# Patient Record
Sex: Male | Born: 1955 | Race: White | Hispanic: No | Marital: Married | Smoking: Never smoker
Health system: Southern US, Community
[De-identification: ages and names within clinical notes are randomized; demographics above are authoritative.]

## PROBLEM LIST (undated history)

## (undated) DIAGNOSIS — E119 Type 2 diabetes mellitus without complications: Secondary | ICD-10-CM

## (undated) DIAGNOSIS — H3530 Unspecified macular degeneration: Secondary | ICD-10-CM

## (undated) DIAGNOSIS — I10 Essential (primary) hypertension: Secondary | ICD-10-CM

## (undated) DIAGNOSIS — K760 Fatty (change of) liver, not elsewhere classified: Secondary | ICD-10-CM

## (undated) DIAGNOSIS — R1310 Dysphagia, unspecified: Secondary | ICD-10-CM

## (undated) DIAGNOSIS — K589 Irritable bowel syndrome without diarrhea: Secondary | ICD-10-CM

## (undated) HISTORY — DX: Irritable bowel syndrome, unspecified: K58.9

## (undated) HISTORY — DX: Dysphagia, unspecified: R13.10

## (undated) HISTORY — DX: Essential (primary) hypertension: I10

## (undated) HISTORY — PX: TONSILLECTOMY: SUR1361

## (undated) HISTORY — PX: KNEE SURGERY: SHX244

---

## 1983-02-23 HISTORY — PX: KNEE SURGERY: SHX244

## 1986-02-22 DIAGNOSIS — K759 Inflammatory liver disease, unspecified: Secondary | ICD-10-CM

## 1986-02-22 HISTORY — DX: Inflammatory liver disease, unspecified: K75.9

## 2003-05-27 ENCOUNTER — Encounter: Admission: RE | Admit: 2003-05-27 | Discharge: 2003-05-27 | Payer: Self-pay | Admitting: Family Medicine

## 2003-07-02 ENCOUNTER — Encounter: Admission: RE | Admit: 2003-07-02 | Discharge: 2003-07-02 | Payer: Self-pay | Admitting: Family Medicine

## 2004-02-23 HISTORY — PX: CATARACT EXTRACTION, BILATERAL: SHX1313

## 2004-11-12 ENCOUNTER — Encounter: Admission: RE | Admit: 2004-11-12 | Discharge: 2004-11-12 | Payer: Self-pay | Admitting: Endocrinology

## 2005-09-23 IMAGING — US US ABDOMEN COMPLETE
1 series · 14 of 25 positions shown · non-contrast
Comparison: None.

CLINICAL DATA: Abnormal LFTs.  Evaluate for fatty infiltration. 
 COMPLETE ABDOMINAL ULTRASOUND:

[Series 1: unknown · 14 of 65 slices shown]
[im 1/65]
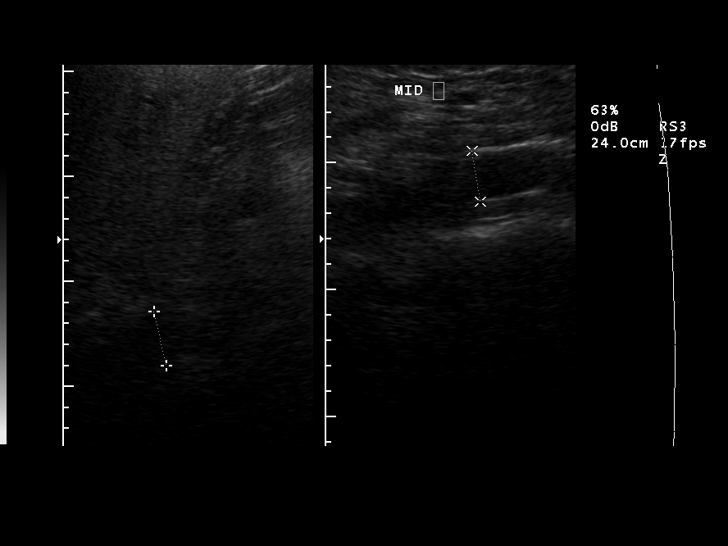
[im 6/65]
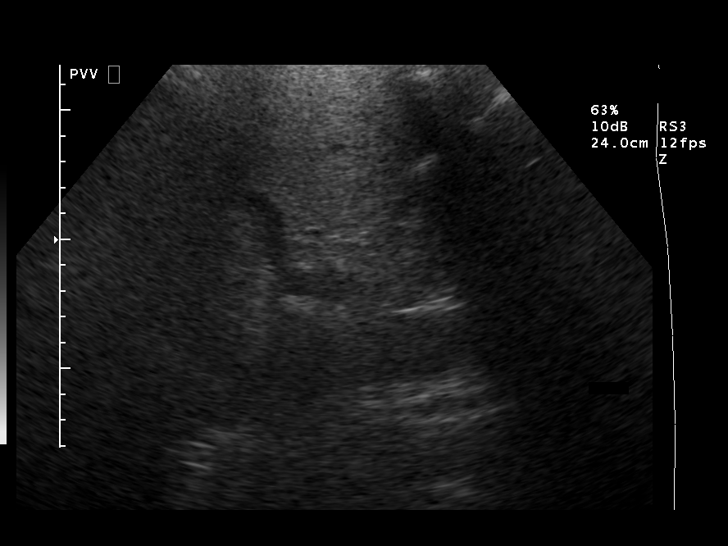
[im 11/65]
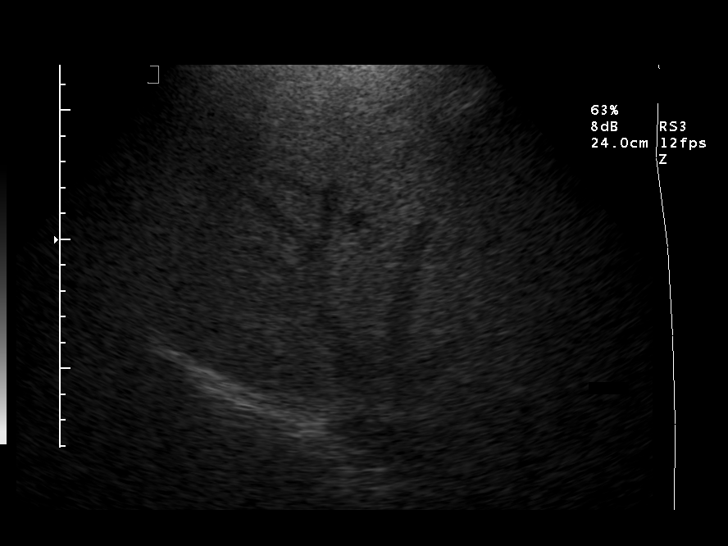
[im 17/65]
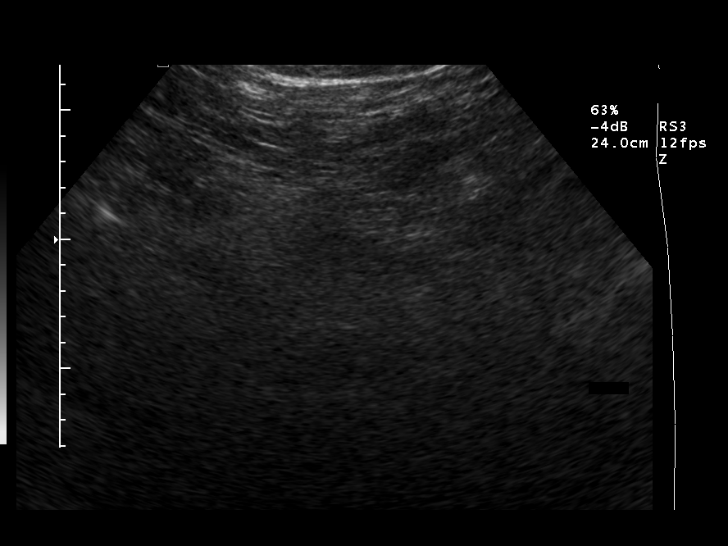
[im 22/65]
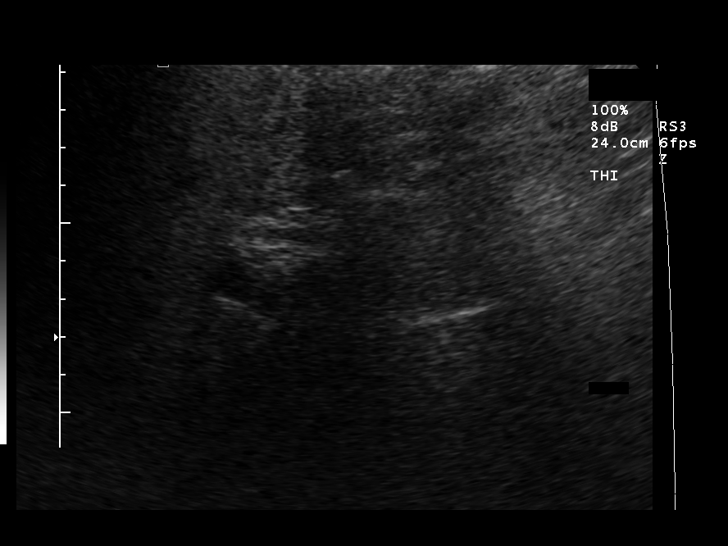
[im 25/65]
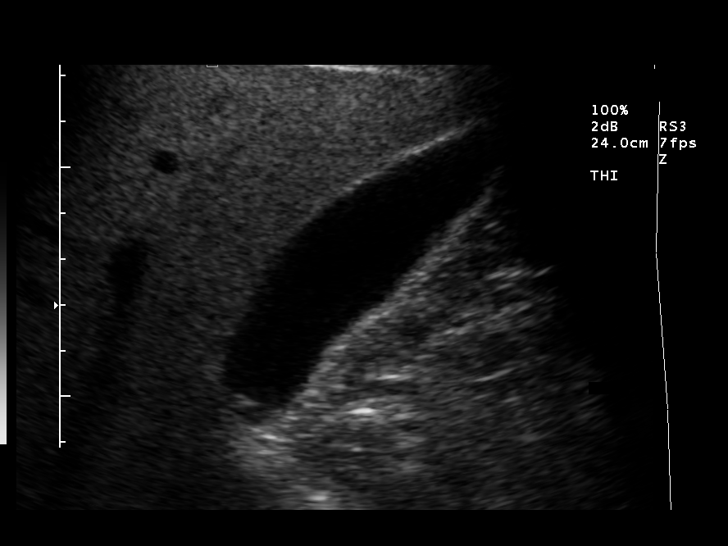
[im 30/65]
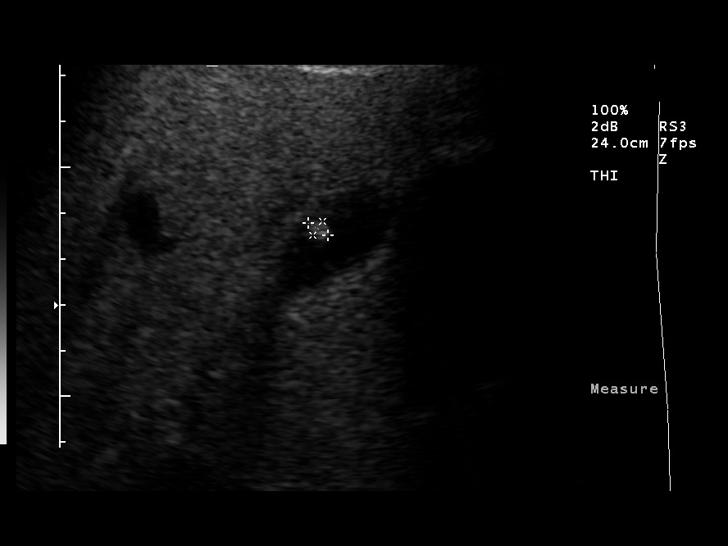
[im 35/65]
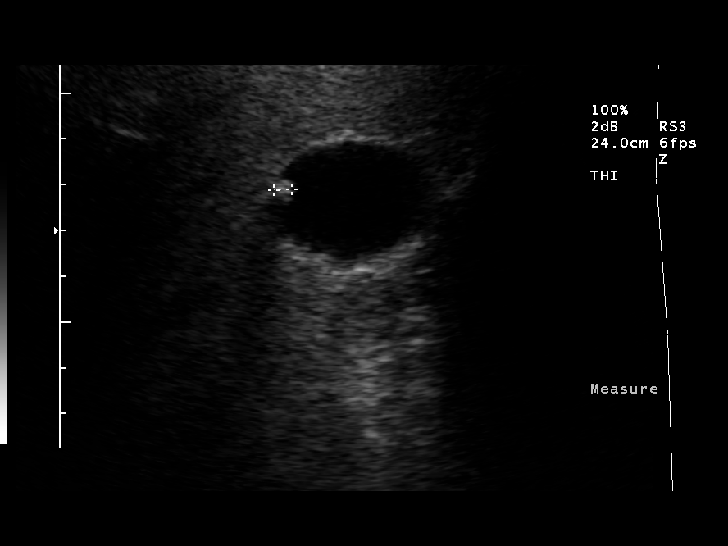
[im 41/65]
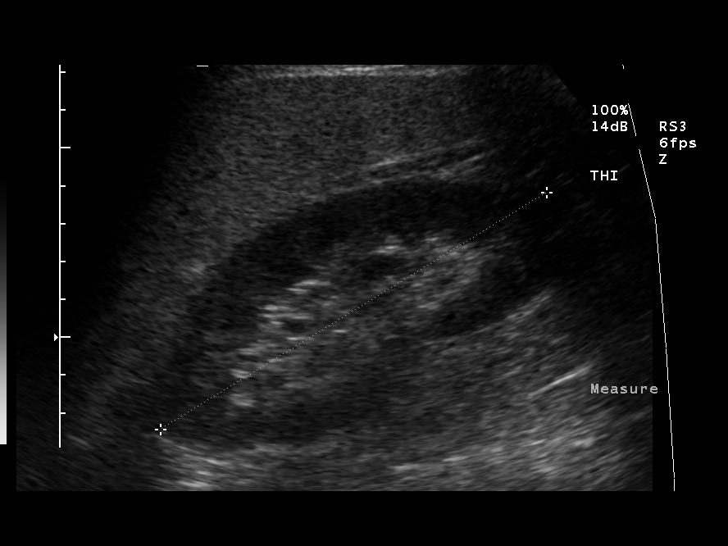
[im 43/65]
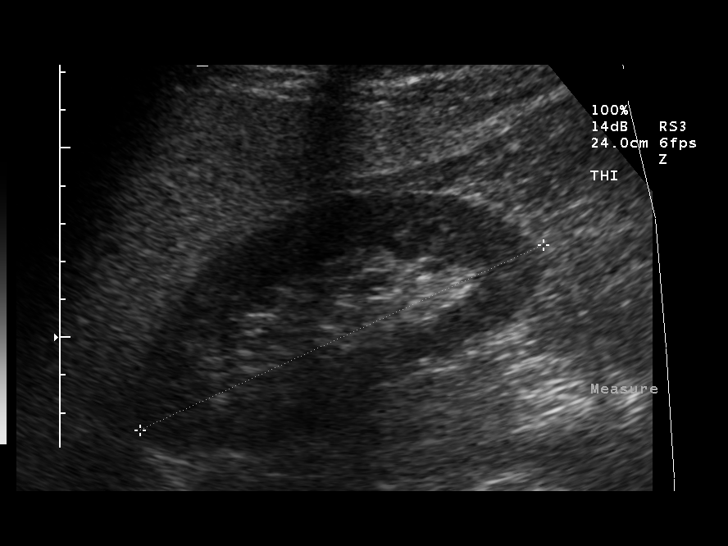
[im 49/65]
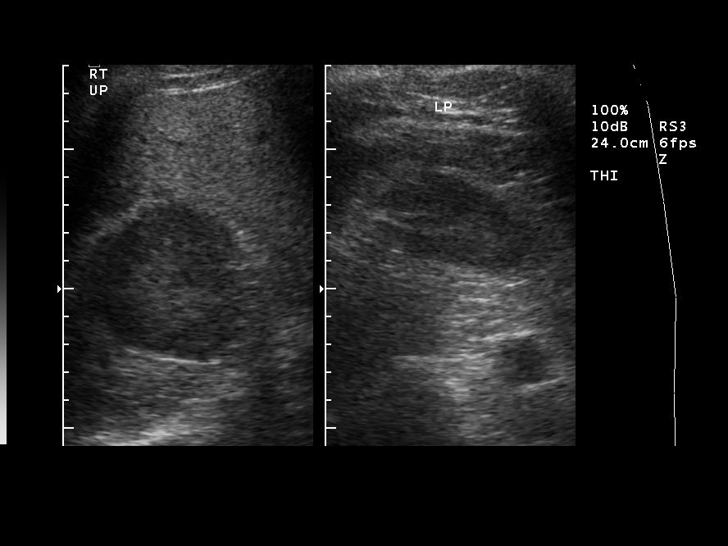
[im 54/65]
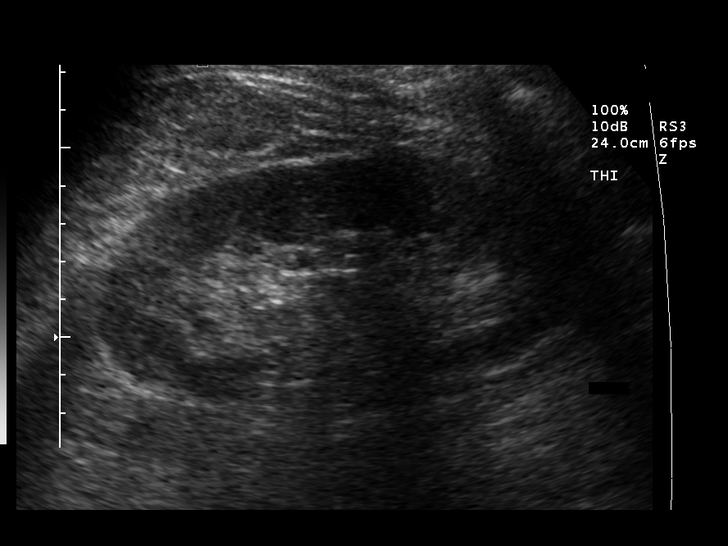
[im 59/65]
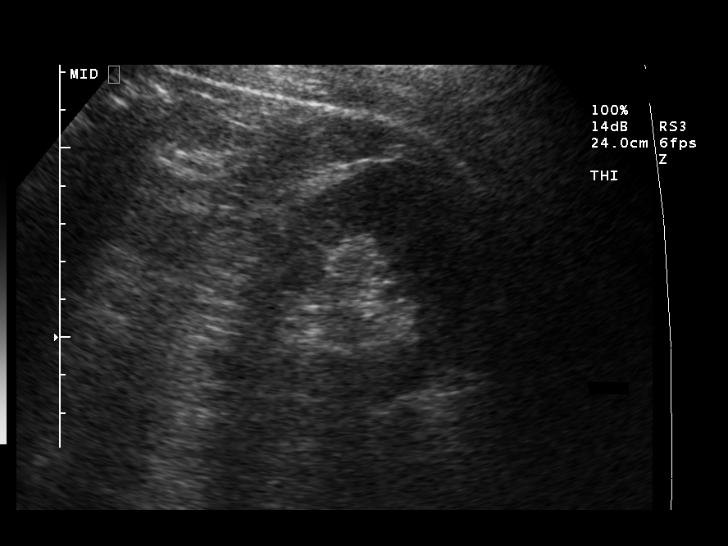
[im 65/65]
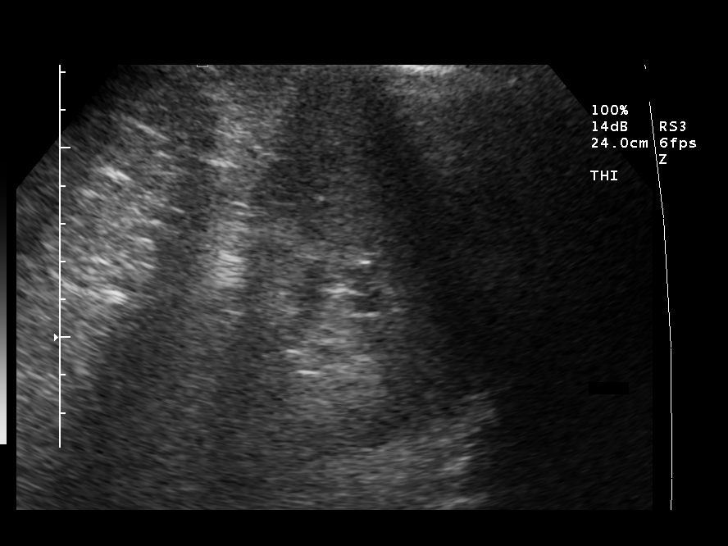

[14 of 25 positions shown; findings below may reference images not displayed]

Diffuse increased echogenicity throughout the liver consistent with fatty infiltration.  The gallbladder has no wall thickening, pericholecystic fluid, or stones.  There are two polypoid filling defects within the non dependent portion of the gallbladder measuring 5 x 4 x 4mm.  A second one is seen within the dependent portion of the gallbladder measuring 5 x 3 x 5mm.  These likely represent gallbladder polyps.  Follow up examination to ensure stability is recommended. 
 Inferior vena cava and pancreas are obscured by overlying bowel gas. 
 Spleen is normal measuring 10.4cm in length. 
 Right kidney is normal measuring 12.0cm in length.  Left kidney is normal measuring 12.7cm in length.  
 The abdominal aorta is normal in AP diameter measuring 2.6cm.
IMPRESSION: 1.  Increased echogenicity of the liver consistent with fatty infiltration. 
 2.  Two polypoid filling defects within the gallbladder measuring 5mm.  Follow up examination in six months is recommended to ensure stability.

## 2007-12-18 ENCOUNTER — Ambulatory Visit: Payer: Self-pay | Admitting: Vascular Surgery

## 2010-07-07 NOTE — Procedures (Signed)
RENAL ARTERY DUPLEX EVALUATION   INDICATION:  Uncontrolled hypertension.   HISTORY:  Diabetes:  No.  Cardiac:  No.  Hypertension:  Yes.  Smoking:  No.   RENAL ARTERY DUPLEX FINDINGS:  Aorta-Proximal:  Not visualized  Aorta-Mid:  86 cm/s  Aorta-Distal:  104 cm/s  Celiac Artery Origin:  Not visualized  SMA Origin:  Not visualized                                    RIGHT               LEFT  Renal Artery Origin:             81 cm/s             76 cm/s  Renal Artery Proximal:           Not visualized      95 cm/s  Renal Artery Mid:                47 cm/s             52 cm/s  Renal Artery Distal:             54 cm/s             57 cm/s  Hilar Acceleration Time (AT):  Renal-Aortic Ratio (RAR):        0.94                1.1  Kidney Size:                     11.32 cm            12.8 cm  End Diastolic Ratio (EDR):  Resistive Index (RI):            0.67                0.67   IMPRESSION:  1. No evidence of increased velocities noted in the bilateral renal      arteries, based on limited visualization.  2. The bilateral kidney sizes and resistive indices are within normal      limits.  3. Unable to adequately visualize the proximal aorta, celiac, superior      mesenteric, and proximal right renal arteries due to overlying      bowel gas patterns.   Preliminary report was faxed to Dr. Elvis Coil office on 12/18/2007.   ___________________________________________  Janetta Hora. Fields, MD   CH/MEDQ  D:  12/18/2007  T:  12/18/2007  Job:  [id number]

## 2010-09-18 ENCOUNTER — Encounter (INDEPENDENT_AMBULATORY_CARE_PROVIDER_SITE_OTHER): Payer: BC Managed Care – PPO | Admitting: Ophthalmology

## 2010-09-18 DIAGNOSIS — H3530 Unspecified macular degeneration: Secondary | ICD-10-CM

## 2010-09-18 DIAGNOSIS — H43819 Vitreous degeneration, unspecified eye: Secondary | ICD-10-CM

## 2010-09-18 DIAGNOSIS — H2510 Age-related nuclear cataract, unspecified eye: Secondary | ICD-10-CM

## 2011-07-23 ENCOUNTER — Encounter: Payer: Self-pay | Admitting: *Deleted

## 2011-07-23 DIAGNOSIS — I10 Essential (primary) hypertension: Secondary | ICD-10-CM

## 2011-09-20 ENCOUNTER — Encounter (INDEPENDENT_AMBULATORY_CARE_PROVIDER_SITE_OTHER): Payer: BC Managed Care – PPO | Admitting: Ophthalmology

## 2011-09-20 DIAGNOSIS — H43819 Vitreous degeneration, unspecified eye: Secondary | ICD-10-CM

## 2011-09-20 DIAGNOSIS — H3551 Vitreoretinal dystrophy: Secondary | ICD-10-CM

## 2011-09-20 DIAGNOSIS — H2510 Age-related nuclear cataract, unspecified eye: Secondary | ICD-10-CM

## 2011-09-20 DIAGNOSIS — H3530 Unspecified macular degeneration: Secondary | ICD-10-CM

## 2012-02-23 DIAGNOSIS — C801 Malignant (primary) neoplasm, unspecified: Secondary | ICD-10-CM

## 2012-02-23 HISTORY — DX: Malignant (primary) neoplasm, unspecified: C80.1

## 2012-09-25 ENCOUNTER — Ambulatory Visit (INDEPENDENT_AMBULATORY_CARE_PROVIDER_SITE_OTHER): Payer: BC Managed Care – PPO | Admitting: Ophthalmology

## 2012-09-25 DIAGNOSIS — H43819 Vitreous degeneration, unspecified eye: Secondary | ICD-10-CM

## 2012-09-25 DIAGNOSIS — H3530 Unspecified macular degeneration: Secondary | ICD-10-CM

## 2013-02-12 ENCOUNTER — Encounter: Payer: Self-pay | Admitting: *Deleted

## 2013-02-12 ENCOUNTER — Encounter: Payer: Self-pay | Admitting: Cardiology

## 2013-02-13 ENCOUNTER — Encounter (INDEPENDENT_AMBULATORY_CARE_PROVIDER_SITE_OTHER): Payer: Self-pay

## 2013-02-13 ENCOUNTER — Encounter: Payer: Self-pay | Admitting: Interventional Cardiology

## 2013-02-13 ENCOUNTER — Ambulatory Visit (INDEPENDENT_AMBULATORY_CARE_PROVIDER_SITE_OTHER): Payer: BC Managed Care – PPO | Admitting: Interventional Cardiology

## 2013-02-13 VITALS — BP 150/98 | HR 80 | Ht 74.0 in | Wt 249.0 lb

## 2013-02-13 DIAGNOSIS — R0600 Dyspnea, unspecified: Secondary | ICD-10-CM | POA: Insufficient documentation

## 2013-02-13 DIAGNOSIS — I10 Essential (primary) hypertension: Secondary | ICD-10-CM | POA: Insufficient documentation

## 2013-02-13 DIAGNOSIS — R079 Chest pain, unspecified: Secondary | ICD-10-CM | POA: Insufficient documentation

## 2013-02-13 DIAGNOSIS — R0609 Other forms of dyspnea: Secondary | ICD-10-CM

## 2013-02-13 NOTE — Patient Instructions (Signed)
Your physician recommends that you continue on your current medications as directed. Please refer to the Current Medication list given to you today.  Your physician has requested that you have an exercise tolerance test. For further information please visit [email or web address] Please also follow instruction sheet, as given.  Follow up pending results of exercise tolerance test

## 2013-02-13 NOTE — Progress Notes (Signed)
Patient ID: Dave Diaz, male   DOB: September 29, 1955, 57 y.o.   MRN: [id number]   Date: 02/13/2013 ID: Dave Diaz, DOB 1955-03-26, MRN [id number] PCP: No primary provider on file.  Reason: Chest discomfort and dyspnea  ASSESSMENT;  1. Chest discomfort, left precordial with atypical features. Discomfort occurs at random and lasts seconds to minutes. 2. Dyspnea on exertion progressive over the past 2-3 months 3. Hypertension 4. History of dysphagia 5. Irritable bowel syndrome  PLAN:  1. Exercise treadmill test   SUBJECTIVE: Dave Diaz is a 57 y.o. male who is referred by Dr. Manus Gunning for evaluation of bleeding left chest discomfort. The discomfort and start spontaneously and usually lasts seconds to no more than a minute. It is not precipitated by physical activity. The patient is relatively vigorous. He walks up to 3 miles with his dog several times per week. No difficulty with breathing during walking. He denies exertional discomfort. He has noted increased dyspnea with other activities such as and of course, walking up an incline, and activities using his arms. In excess treadmill test was performed in 2001 was negative for evidence of ischemia.   Allergies  Allergen Reactions  . Aspirin     BLEEDING  . Symmetrel [Amantadine]     HIVES    Current Outpatient Prescriptions on File Prior to Visit  Medication Sig Dispense Refill  . amLODipine (NORVASC) 2.5 MG tablet Take 2.5 mg by mouth daily.      . fish oil-omega-3 fatty acids 1000 MG capsule Take 2 g by mouth daily.       . irbesartan (AVAPRO) 300 MG tablet Take 300 mg by mouth at bedtime.      . Multiple Vitamins-Minerals (PRESERVISION AREDS 2 PO) Take by mouth daily.       No current facility-administered medications on file prior to visit.    Past Medical History  Diagnosis Date  . Hypertension   . IBS (irritable bowel syndrome)   . Dysphagia     Past Surgical History  Procedure Laterality Date  .  Cataract extraction, bilateral  2006  . Knee surgery      History   Social History  . Marital Status: Single    Spouse Name: N/A    Number of Children: N/A  . Years of Education: N/A   Occupational History  . Not on file.   Social History Main Topics  . Smoking status: Never Smoker   . Smokeless tobacco: Never Used  . Alcohol Use: No  . Drug Use: No  . Sexual Activity:    Other Topics Concern  . Not on file   Social History Narrative  . No narrative on file    Family History  Problem Relation Age of Onset  . Stroke Mother   . Diabetes Mother     ROS: Trace lower extremity edema. No claudication. No transient ischemic attacks or events. Denies syncope. No palpitations. Denies orthopnea and PND.Marland Kitchen Other systems negative for complaints.  OBJECTIVE: BP 150/98  Pulse 80  Ht 6\' 2"  (1.88 m)  Wt 249 lb (112.946 kg)  BMI 31.96 kg/m2,  General: No acute distress, obese HEENT: normal  Neck: JVD flat. Carotids no Chest: Clear Cardiac: Murmur: Absent. Gallop: S4. Rhythm: Normal. Other: Normal Abdomen: Bruit: Absent. Pulsation: Absent Extremities: Edema: No edema. Pulses: 2+ Neuro: Normal Psych: Normal  ECG: Performed at [facility] on 02/06/13 is normal.

## 2013-02-20 ENCOUNTER — Telehealth: Payer: Self-pay | Admitting: Interventional Cardiology

## 2013-02-20 NOTE — Telephone Encounter (Signed)
pt instructed to hold amlodipine the morning of gxt and take it after test.pt verbalized understanding.

## 2013-02-20 NOTE — Telephone Encounter (Signed)
New medication   Pt states that he received a call advising him not to take amlodipine before EXT on 12/31. However he was advised to take the medication for the EXT by Dr. Smith// He requesting a call back for clarification.. Please call

## 2013-02-21 ENCOUNTER — Ambulatory Visit (HOSPITAL_COMMUNITY)
Admission: RE | Admit: 2013-02-21 | Discharge: 2013-02-21 | Disposition: A | Discharge status: Home / Self Care | Payer: BC Managed Care – PPO | Source: Ambulatory Visit | Attending: Interventional Cardiology | Admitting: Interventional Cardiology

## 2013-02-21 DIAGNOSIS — R079 Chest pain, unspecified: Secondary | ICD-10-CM

## 2013-03-06 ENCOUNTER — Telehealth: Payer: Self-pay

## 2013-03-06 NOTE — Telephone Encounter (Signed)
lmom for pt to return call, and give pt Dr.Smith instructions to start HCTZ and sch f/u

## 2013-03-06 NOTE — Telephone Encounter (Signed)
Message copied by Lamar Laundry on Tue Mar 06, 2013 10:50 AM ------      Message from: Daneen Schick      Created: Sun Mar 04, 2013  8:47 AM      Regarding: F/U result of ETT       Please have the patient start HCTZ 12.5 mg daily. Need OV 1-2 weeks later for clinical f/u.      Let him know the stress test showed severe hypertension. ------

## 2013-03-06 NOTE — Telephone Encounter (Signed)
Message copied by Lamar Laundry on Tue Mar 06, 2013  9:09 AM ------      Message from: Daneen Schick      Created: Sun Mar 04, 2013  4:04 PM       Very high BP with activity. No obvious blockage. Adding Hctz and needs f/u OV to assess for improvement. ------

## 2013-03-07 NOTE — Telephone Encounter (Signed)
called pt pt given results of stress test .stress test showed severe hypertension. pt given Dr.Smith instructions to start Hctz 12.5mg  daily. pt sts that he will not take hctz he has taken it in the past and it elevated his blood sugar and false diabetic readings..pt sts he believes his bp is controlled and only elevated in doctors office.pt sts that his htn is managed by a [facility] physician and he does need to add additional bp med.

## 2013-03-07 NOTE — Telephone Encounter (Signed)
Message copied by Lamar Laundry on Wed Mar 07, 2013  9:04 AM ------      Message from: Daneen Schick      Created: Sun Mar 04, 2013  8:47 AM      Regarding: F/U result of ETT       Please have the patient start HCTZ 12.5 mg daily. Need OV 1-2 weeks later for clinical f/u.      Let him know the stress test showed severe hypertension. ------

## 2013-03-07 NOTE — Telephone Encounter (Signed)
Message copied by Lamar Laundry on Wed Mar 07, 2013  2:03 PM ------      Message from: Daneen Schick      Created: Sun Mar 04, 2013  8:47 AM      Regarding: F/U result of ETT       Please have the patient start HCTZ 12.5 mg daily. Need OV 1-2 weeks later for clinical f/u.      Let him know the stress test showed severe hypertension. ------

## 2013-03-07 NOTE — Telephone Encounter (Signed)
2nd attempt. called pt to give gxt results and Dr.Smith instructions to start HCTZ 12.5mg  daily due to severe htn

## 2013-09-25 ENCOUNTER — Ambulatory Visit (INDEPENDENT_AMBULATORY_CARE_PROVIDER_SITE_OTHER): Payer: BC Managed Care – PPO | Admitting: Ophthalmology

## 2013-09-26 ENCOUNTER — Ambulatory Visit (INDEPENDENT_AMBULATORY_CARE_PROVIDER_SITE_OTHER): Payer: BC Managed Care – PPO | Admitting: Ophthalmology

## 2013-09-26 DIAGNOSIS — I10 Essential (primary) hypertension: Secondary | ICD-10-CM

## 2013-09-26 DIAGNOSIS — H2510 Age-related nuclear cataract, unspecified eye: Secondary | ICD-10-CM

## 2013-09-26 DIAGNOSIS — H43819 Vitreous degeneration, unspecified eye: Secondary | ICD-10-CM

## 2013-09-26 DIAGNOSIS — H3530 Unspecified macular degeneration: Secondary | ICD-10-CM

## 2013-09-26 DIAGNOSIS — H35039 Hypertensive retinopathy, unspecified eye: Secondary | ICD-10-CM

## 2014-08-21 ENCOUNTER — Ambulatory Visit (INDEPENDENT_AMBULATORY_CARE_PROVIDER_SITE_OTHER): Payer: BLUE CROSS/BLUE SHIELD | Admitting: Ophthalmology

## 2014-08-21 DIAGNOSIS — H3531 Nonexudative age-related macular degeneration: Secondary | ICD-10-CM | POA: Diagnosis not present

## 2014-08-21 DIAGNOSIS — H35033 Hypertensive retinopathy, bilateral: Secondary | ICD-10-CM

## 2014-08-21 DIAGNOSIS — H43813 Vitreous degeneration, bilateral: Secondary | ICD-10-CM | POA: Diagnosis not present

## 2014-08-21 DIAGNOSIS — H26491 Other secondary cataract, right eye: Secondary | ICD-10-CM

## 2014-09-17 ENCOUNTER — Ambulatory Visit: Payer: BLUE CROSS/BLUE SHIELD

## 2014-09-24 ENCOUNTER — Ambulatory Visit: Payer: BLUE CROSS/BLUE SHIELD

## 2014-10-01 ENCOUNTER — Ambulatory Visit: Payer: BLUE CROSS/BLUE SHIELD

## 2014-10-01 ENCOUNTER — Ambulatory Visit (INDEPENDENT_AMBULATORY_CARE_PROVIDER_SITE_OTHER): Payer: BC Managed Care – PPO | Admitting: Ophthalmology

## 2015-07-28 DIAGNOSIS — I10 Essential (primary) hypertension: Secondary | ICD-10-CM | POA: Diagnosis not present

## 2015-07-28 DIAGNOSIS — Z1211 Encounter for screening for malignant neoplasm of colon: Secondary | ICD-10-CM | POA: Diagnosis not present

## 2015-07-28 DIAGNOSIS — Z0000 Encounter for general adult medical examination without abnormal findings: Secondary | ICD-10-CM | POA: Diagnosis not present

## 2015-07-28 DIAGNOSIS — E119 Type 2 diabetes mellitus without complications: Secondary | ICD-10-CM | POA: Diagnosis not present

## 2015-08-20 ENCOUNTER — Ambulatory Visit (INDEPENDENT_AMBULATORY_CARE_PROVIDER_SITE_OTHER): Payer: BLUE CROSS/BLUE SHIELD | Admitting: Ophthalmology

## 2015-08-22 ENCOUNTER — Ambulatory Visit (INDEPENDENT_AMBULATORY_CARE_PROVIDER_SITE_OTHER): Payer: BLUE CROSS/BLUE SHIELD | Admitting: Ophthalmology

## 2015-08-22 DIAGNOSIS — H353132 Nonexudative age-related macular degeneration, bilateral, intermediate dry stage: Secondary | ICD-10-CM

## 2015-08-22 DIAGNOSIS — H43813 Vitreous degeneration, bilateral: Secondary | ICD-10-CM

## 2015-08-22 DIAGNOSIS — D3132 Benign neoplasm of left choroid: Secondary | ICD-10-CM | POA: Diagnosis not present

## 2015-08-22 DIAGNOSIS — H35033 Hypertensive retinopathy, bilateral: Secondary | ICD-10-CM

## 2015-08-22 DIAGNOSIS — I10 Essential (primary) hypertension: Secondary | ICD-10-CM

## 2015-10-29 ENCOUNTER — Encounter (INDEPENDENT_AMBULATORY_CARE_PROVIDER_SITE_OTHER): Payer: BLUE CROSS/BLUE SHIELD | Admitting: Ophthalmology

## 2015-10-29 DIAGNOSIS — H353132 Nonexudative age-related macular degeneration, bilateral, intermediate dry stage: Secondary | ICD-10-CM | POA: Diagnosis not present

## 2015-10-29 DIAGNOSIS — D3132 Benign neoplasm of left choroid: Secondary | ICD-10-CM

## 2015-10-29 DIAGNOSIS — I10 Essential (primary) hypertension: Secondary | ICD-10-CM | POA: Diagnosis not present

## 2015-10-29 DIAGNOSIS — H35033 Hypertensive retinopathy, bilateral: Secondary | ICD-10-CM

## 2015-10-29 DIAGNOSIS — H43813 Vitreous degeneration, bilateral: Secondary | ICD-10-CM | POA: Diagnosis not present

## 2015-11-26 DIAGNOSIS — S90121A Contusion of right lesser toe(s) without damage to nail, initial encounter: Secondary | ICD-10-CM | POA: Diagnosis not present

## 2015-12-16 DIAGNOSIS — M25462 Effusion, left knee: Secondary | ICD-10-CM | POA: Diagnosis not present

## 2015-12-16 DIAGNOSIS — C44311 Basal cell carcinoma of skin of nose: Secondary | ICD-10-CM | POA: Diagnosis not present

## 2015-12-16 DIAGNOSIS — M25562 Pain in left knee: Secondary | ICD-10-CM | POA: Diagnosis not present

## 2016-01-06 DIAGNOSIS — M25462 Effusion, left knee: Secondary | ICD-10-CM | POA: Diagnosis not present

## 2016-01-27 DIAGNOSIS — E119 Type 2 diabetes mellitus without complications: Secondary | ICD-10-CM | POA: Diagnosis not present

## 2016-01-27 DIAGNOSIS — R748 Abnormal levels of other serum enzymes: Secondary | ICD-10-CM | POA: Diagnosis not present

## 2016-01-27 DIAGNOSIS — I10 Essential (primary) hypertension: Secondary | ICD-10-CM | POA: Diagnosis not present

## 2016-01-28 ENCOUNTER — Other Ambulatory Visit: Payer: Self-pay | Admitting: Family Medicine

## 2016-01-28 DIAGNOSIS — R7989 Other specified abnormal findings of blood chemistry: Secondary | ICD-10-CM

## 2016-01-28 DIAGNOSIS — R945 Abnormal results of liver function studies: Principal | ICD-10-CM

## 2016-02-06 ENCOUNTER — Other Ambulatory Visit: Payer: BLUE CROSS/BLUE SHIELD

## 2016-05-17 DIAGNOSIS — H353130 Nonexudative age-related macular degeneration, bilateral, stage unspecified: Secondary | ICD-10-CM | POA: Diagnosis not present

## 2016-05-17 DIAGNOSIS — I10 Essential (primary) hypertension: Secondary | ICD-10-CM | POA: Diagnosis not present

## 2016-05-17 DIAGNOSIS — H01009 Unspecified blepharitis unspecified eye, unspecified eyelid: Secondary | ICD-10-CM | POA: Diagnosis not present

## 2016-05-17 DIAGNOSIS — H16223 Keratoconjunctivitis sicca, not specified as Sjogren's, bilateral: Secondary | ICD-10-CM | POA: Diagnosis not present

## 2016-05-19 DIAGNOSIS — E119 Type 2 diabetes mellitus without complications: Secondary | ICD-10-CM | POA: Diagnosis not present

## 2016-05-19 DIAGNOSIS — I10 Essential (primary) hypertension: Secondary | ICD-10-CM | POA: Diagnosis not present

## 2016-05-19 DIAGNOSIS — Z1211 Encounter for screening for malignant neoplasm of colon: Secondary | ICD-10-CM | POA: Diagnosis not present

## 2016-07-26 DIAGNOSIS — J069 Acute upper respiratory infection, unspecified: Secondary | ICD-10-CM | POA: Diagnosis not present

## 2016-07-26 DIAGNOSIS — R05 Cough: Secondary | ICD-10-CM | POA: Diagnosis not present

## 2016-08-13 DIAGNOSIS — L570 Actinic keratosis: Secondary | ICD-10-CM | POA: Diagnosis not present

## 2016-08-13 DIAGNOSIS — X32XXXD Exposure to sunlight, subsequent encounter: Secondary | ICD-10-CM | POA: Diagnosis not present

## 2016-08-13 DIAGNOSIS — D225 Melanocytic nevi of trunk: Secondary | ICD-10-CM | POA: Diagnosis not present

## 2016-08-23 ENCOUNTER — Ambulatory Visit (INDEPENDENT_AMBULATORY_CARE_PROVIDER_SITE_OTHER): Payer: BLUE CROSS/BLUE SHIELD | Admitting: Ophthalmology

## 2016-08-23 DIAGNOSIS — D3132 Benign neoplasm of left choroid: Secondary | ICD-10-CM | POA: Diagnosis not present

## 2016-08-23 DIAGNOSIS — I10 Essential (primary) hypertension: Secondary | ICD-10-CM | POA: Diagnosis not present

## 2016-08-23 DIAGNOSIS — H43813 Vitreous degeneration, bilateral: Secondary | ICD-10-CM

## 2016-08-23 DIAGNOSIS — H35033 Hypertensive retinopathy, bilateral: Secondary | ICD-10-CM | POA: Diagnosis not present

## 2016-08-23 DIAGNOSIS — H353132 Nonexudative age-related macular degeneration, bilateral, intermediate dry stage: Secondary | ICD-10-CM | POA: Diagnosis not present

## 2016-08-24 DIAGNOSIS — I10 Essential (primary) hypertension: Secondary | ICD-10-CM | POA: Diagnosis not present

## 2016-08-24 DIAGNOSIS — E1165 Type 2 diabetes mellitus with hyperglycemia: Secondary | ICD-10-CM | POA: Diagnosis not present

## 2016-08-24 DIAGNOSIS — M7711 Lateral epicondylitis, right elbow: Secondary | ICD-10-CM | POA: Diagnosis not present

## 2016-08-24 DIAGNOSIS — N529 Male erectile dysfunction, unspecified: Secondary | ICD-10-CM | POA: Diagnosis not present

## 2016-09-27 DIAGNOSIS — Z800 Family history of malignant neoplasm of digestive organs: Secondary | ICD-10-CM | POA: Diagnosis not present

## 2016-09-27 DIAGNOSIS — Z1211 Encounter for screening for malignant neoplasm of colon: Secondary | ICD-10-CM | POA: Diagnosis not present

## 2016-11-17 DIAGNOSIS — R0609 Other forms of dyspnea: Secondary | ICD-10-CM | POA: Diagnosis not present

## 2016-11-17 DIAGNOSIS — F411 Generalized anxiety disorder: Secondary | ICD-10-CM | POA: Diagnosis not present

## 2016-12-20 DIAGNOSIS — H43392 Other vitreous opacities, left eye: Secondary | ICD-10-CM | POA: Diagnosis not present

## 2016-12-27 DIAGNOSIS — M9905 Segmental and somatic dysfunction of pelvic region: Secondary | ICD-10-CM | POA: Diagnosis not present

## 2016-12-27 DIAGNOSIS — M9903 Segmental and somatic dysfunction of lumbar region: Secondary | ICD-10-CM | POA: Diagnosis not present

## 2016-12-27 DIAGNOSIS — M461 Sacroiliitis, not elsewhere classified: Secondary | ICD-10-CM | POA: Diagnosis not present

## 2016-12-27 DIAGNOSIS — M5137 Other intervertebral disc degeneration, lumbosacral region: Secondary | ICD-10-CM | POA: Diagnosis not present

## 2017-01-25 DIAGNOSIS — M545 Low back pain: Secondary | ICD-10-CM | POA: Diagnosis not present

## 2017-01-26 ENCOUNTER — Encounter (INDEPENDENT_AMBULATORY_CARE_PROVIDER_SITE_OTHER): Payer: BLUE CROSS/BLUE SHIELD | Admitting: Ophthalmology

## 2017-01-26 DIAGNOSIS — H353130 Nonexudative age-related macular degeneration, bilateral, stage unspecified: Secondary | ICD-10-CM | POA: Diagnosis not present

## 2017-01-26 DIAGNOSIS — H353132 Nonexudative age-related macular degeneration, bilateral, intermediate dry stage: Secondary | ICD-10-CM

## 2017-01-26 DIAGNOSIS — H01009 Unspecified blepharitis unspecified eye, unspecified eyelid: Secondary | ICD-10-CM | POA: Diagnosis not present

## 2017-01-26 DIAGNOSIS — H16223 Keratoconjunctivitis sicca, not specified as Sjogren's, bilateral: Secondary | ICD-10-CM | POA: Diagnosis not present

## 2017-01-26 DIAGNOSIS — H43813 Vitreous degeneration, bilateral: Secondary | ICD-10-CM | POA: Diagnosis not present

## 2017-01-26 DIAGNOSIS — H35372 Puckering of macula, left eye: Secondary | ICD-10-CM

## 2017-01-26 DIAGNOSIS — D3132 Benign neoplasm of left choroid: Secondary | ICD-10-CM

## 2017-01-26 DIAGNOSIS — H43392 Other vitreous opacities, left eye: Secondary | ICD-10-CM | POA: Diagnosis not present

## 2017-01-26 DIAGNOSIS — H35033 Hypertensive retinopathy, bilateral: Secondary | ICD-10-CM

## 2017-01-26 DIAGNOSIS — I10 Essential (primary) hypertension: Secondary | ICD-10-CM | POA: Diagnosis not present

## 2017-03-01 DIAGNOSIS — M533 Sacrococcygeal disorders, not elsewhere classified: Secondary | ICD-10-CM | POA: Diagnosis not present

## 2017-03-14 DIAGNOSIS — Z125 Encounter for screening for malignant neoplasm of prostate: Secondary | ICD-10-CM | POA: Diagnosis not present

## 2017-03-14 DIAGNOSIS — I10 Essential (primary) hypertension: Secondary | ICD-10-CM | POA: Diagnosis not present

## 2017-03-14 DIAGNOSIS — E119 Type 2 diabetes mellitus without complications: Secondary | ICD-10-CM | POA: Diagnosis not present

## 2017-03-29 DIAGNOSIS — M5441 Lumbago with sciatica, right side: Secondary | ICD-10-CM | POA: Diagnosis not present

## 2017-03-29 DIAGNOSIS — M25552 Pain in left hip: Secondary | ICD-10-CM | POA: Diagnosis not present

## 2017-03-29 DIAGNOSIS — M545 Low back pain: Secondary | ICD-10-CM | POA: Diagnosis not present

## 2017-03-29 DIAGNOSIS — M25551 Pain in right hip: Secondary | ICD-10-CM | POA: Diagnosis not present

## 2017-03-30 DIAGNOSIS — M545 Low back pain: Secondary | ICD-10-CM | POA: Diagnosis not present

## 2017-03-30 DIAGNOSIS — M4726 Other spondylosis with radiculopathy, lumbar region: Secondary | ICD-10-CM | POA: Diagnosis not present

## 2017-04-04 DIAGNOSIS — R42 Dizziness and giddiness: Secondary | ICD-10-CM | POA: Diagnosis not present

## 2017-04-12 DIAGNOSIS — M545 Low back pain: Secondary | ICD-10-CM | POA: Diagnosis not present

## 2017-04-12 DIAGNOSIS — M4726 Other spondylosis with radiculopathy, lumbar region: Secondary | ICD-10-CM | POA: Diagnosis not present

## 2017-04-18 DIAGNOSIS — M5136 Other intervertebral disc degeneration, lumbar region: Secondary | ICD-10-CM | POA: Diagnosis not present

## 2017-04-18 DIAGNOSIS — M4316 Spondylolisthesis, lumbar region: Secondary | ICD-10-CM | POA: Diagnosis not present

## 2017-04-18 DIAGNOSIS — M48062 Spinal stenosis, lumbar region with neurogenic claudication: Secondary | ICD-10-CM | POA: Diagnosis not present

## 2017-04-18 DIAGNOSIS — M549 Dorsalgia, unspecified: Secondary | ICD-10-CM | POA: Diagnosis not present

## 2017-04-18 DIAGNOSIS — M47816 Spondylosis without myelopathy or radiculopathy, lumbar region: Secondary | ICD-10-CM | POA: Diagnosis not present

## 2017-04-18 DIAGNOSIS — M546 Pain in thoracic spine: Secondary | ICD-10-CM | POA: Diagnosis not present

## 2017-04-19 DIAGNOSIS — M545 Low back pain: Secondary | ICD-10-CM | POA: Diagnosis not present

## 2017-04-19 DIAGNOSIS — M4726 Other spondylosis with radiculopathy, lumbar region: Secondary | ICD-10-CM | POA: Diagnosis not present

## 2017-04-25 DIAGNOSIS — M545 Low back pain: Secondary | ICD-10-CM | POA: Diagnosis not present

## 2017-04-25 DIAGNOSIS — M4726 Other spondylosis with radiculopathy, lumbar region: Secondary | ICD-10-CM | POA: Diagnosis not present

## 2017-04-28 DIAGNOSIS — M4316 Spondylolisthesis, lumbar region: Secondary | ICD-10-CM | POA: Diagnosis not present

## 2017-04-28 DIAGNOSIS — M47816 Spondylosis without myelopathy or radiculopathy, lumbar region: Secondary | ICD-10-CM | POA: Diagnosis not present

## 2017-05-10 DIAGNOSIS — M47816 Spondylosis without myelopathy or radiculopathy, lumbar region: Secondary | ICD-10-CM | POA: Diagnosis not present

## 2017-05-10 DIAGNOSIS — M4316 Spondylolisthesis, lumbar region: Secondary | ICD-10-CM | POA: Diagnosis not present

## 2017-05-10 DIAGNOSIS — M5136 Other intervertebral disc degeneration, lumbar region: Secondary | ICD-10-CM | POA: Diagnosis not present

## 2017-05-10 DIAGNOSIS — M48062 Spinal stenosis, lumbar region with neurogenic claudication: Secondary | ICD-10-CM | POA: Diagnosis not present

## 2017-05-19 DIAGNOSIS — M545 Low back pain: Secondary | ICD-10-CM | POA: Diagnosis not present

## 2017-05-19 DIAGNOSIS — M5441 Lumbago with sciatica, right side: Secondary | ICD-10-CM | POA: Diagnosis not present

## 2017-05-19 DIAGNOSIS — M5442 Lumbago with sciatica, left side: Secondary | ICD-10-CM | POA: Diagnosis not present

## 2017-05-30 DIAGNOSIS — M48061 Spinal stenosis, lumbar region without neurogenic claudication: Secondary | ICD-10-CM | POA: Diagnosis not present

## 2017-05-30 DIAGNOSIS — M4726 Other spondylosis with radiculopathy, lumbar region: Secondary | ICD-10-CM | POA: Diagnosis not present

## 2017-05-30 DIAGNOSIS — M5136 Other intervertebral disc degeneration, lumbar region: Secondary | ICD-10-CM | POA: Diagnosis not present

## 2017-08-15 DIAGNOSIS — M48062 Spinal stenosis, lumbar region with neurogenic claudication: Secondary | ICD-10-CM | POA: Diagnosis not present

## 2017-08-15 DIAGNOSIS — M5136 Other intervertebral disc degeneration, lumbar region: Secondary | ICD-10-CM | POA: Diagnosis not present

## 2017-08-15 DIAGNOSIS — M47816 Spondylosis without myelopathy or radiculopathy, lumbar region: Secondary | ICD-10-CM | POA: Diagnosis not present

## 2017-08-15 DIAGNOSIS — M4316 Spondylolisthesis, lumbar region: Secondary | ICD-10-CM | POA: Diagnosis not present

## 2017-08-23 ENCOUNTER — Ambulatory Visit (INDEPENDENT_AMBULATORY_CARE_PROVIDER_SITE_OTHER): Payer: BLUE CROSS/BLUE SHIELD | Admitting: Ophthalmology

## 2017-08-23 DIAGNOSIS — H35033 Hypertensive retinopathy, bilateral: Secondary | ICD-10-CM

## 2017-08-23 DIAGNOSIS — H353132 Nonexudative age-related macular degeneration, bilateral, intermediate dry stage: Secondary | ICD-10-CM | POA: Diagnosis not present

## 2017-08-23 DIAGNOSIS — D3132 Benign neoplasm of left choroid: Secondary | ICD-10-CM | POA: Diagnosis not present

## 2017-08-23 DIAGNOSIS — H43813 Vitreous degeneration, bilateral: Secondary | ICD-10-CM | POA: Diagnosis not present

## 2017-08-23 DIAGNOSIS — H34832 Tributary (branch) retinal vein occlusion, left eye, with macular edema: Secondary | ICD-10-CM

## 2017-08-23 DIAGNOSIS — I10 Essential (primary) hypertension: Secondary | ICD-10-CM | POA: Diagnosis not present

## 2017-08-23 DIAGNOSIS — H35372 Puckering of macula, left eye: Secondary | ICD-10-CM

## 2017-08-24 DIAGNOSIS — E119 Type 2 diabetes mellitus without complications: Secondary | ICD-10-CM | POA: Diagnosis not present

## 2017-08-24 DIAGNOSIS — I10 Essential (primary) hypertension: Secondary | ICD-10-CM | POA: Diagnosis not present

## 2017-08-24 DIAGNOSIS — M519 Unspecified thoracic, thoracolumbar and lumbosacral intervertebral disc disorder: Secondary | ICD-10-CM | POA: Diagnosis not present

## 2017-08-24 DIAGNOSIS — G479 Sleep disorder, unspecified: Secondary | ICD-10-CM | POA: Diagnosis not present

## 2017-09-15 ENCOUNTER — Encounter (INDEPENDENT_AMBULATORY_CARE_PROVIDER_SITE_OTHER): Payer: BLUE CROSS/BLUE SHIELD | Admitting: Ophthalmology

## 2017-09-15 DIAGNOSIS — H353132 Nonexudative age-related macular degeneration, bilateral, intermediate dry stage: Secondary | ICD-10-CM | POA: Diagnosis not present

## 2017-09-15 DIAGNOSIS — I10 Essential (primary) hypertension: Secondary | ICD-10-CM

## 2017-09-15 DIAGNOSIS — H34831 Tributary (branch) retinal vein occlusion, right eye, with macular edema: Secondary | ICD-10-CM | POA: Diagnosis not present

## 2017-09-15 DIAGNOSIS — H35033 Hypertensive retinopathy, bilateral: Secondary | ICD-10-CM

## 2017-09-15 DIAGNOSIS — D3132 Benign neoplasm of left choroid: Secondary | ICD-10-CM

## 2017-09-15 DIAGNOSIS — H43813 Vitreous degeneration, bilateral: Secondary | ICD-10-CM

## 2017-09-21 DIAGNOSIS — H43392 Other vitreous opacities, left eye: Secondary | ICD-10-CM | POA: Diagnosis not present

## 2017-10-04 DIAGNOSIS — M47816 Spondylosis without myelopathy or radiculopathy, lumbar region: Secondary | ICD-10-CM | POA: Diagnosis not present

## 2017-10-04 DIAGNOSIS — M5136 Other intervertebral disc degeneration, lumbar region: Secondary | ICD-10-CM | POA: Diagnosis not present

## 2017-10-04 DIAGNOSIS — M48062 Spinal stenosis, lumbar region with neurogenic claudication: Secondary | ICD-10-CM | POA: Diagnosis not present

## 2017-10-04 DIAGNOSIS — M4316 Spondylolisthesis, lumbar region: Secondary | ICD-10-CM | POA: Diagnosis not present

## 2017-10-13 ENCOUNTER — Encounter (INDEPENDENT_AMBULATORY_CARE_PROVIDER_SITE_OTHER): Payer: BLUE CROSS/BLUE SHIELD | Admitting: Ophthalmology

## 2017-10-13 DIAGNOSIS — I10 Essential (primary) hypertension: Secondary | ICD-10-CM | POA: Diagnosis not present

## 2017-10-13 DIAGNOSIS — H34832 Tributary (branch) retinal vein occlusion, left eye, with macular edema: Secondary | ICD-10-CM

## 2017-10-13 DIAGNOSIS — D3132 Benign neoplasm of left choroid: Secondary | ICD-10-CM

## 2017-10-13 DIAGNOSIS — H353132 Nonexudative age-related macular degeneration, bilateral, intermediate dry stage: Secondary | ICD-10-CM | POA: Diagnosis not present

## 2017-10-13 DIAGNOSIS — H35033 Hypertensive retinopathy, bilateral: Secondary | ICD-10-CM

## 2017-10-13 DIAGNOSIS — H43813 Vitreous degeneration, bilateral: Secondary | ICD-10-CM

## 2017-10-20 DIAGNOSIS — M48061 Spinal stenosis, lumbar region without neurogenic claudication: Secondary | ICD-10-CM | POA: Diagnosis not present

## 2017-10-20 DIAGNOSIS — M5136 Other intervertebral disc degeneration, lumbar region: Secondary | ICD-10-CM | POA: Diagnosis not present

## 2017-10-20 DIAGNOSIS — M4726 Other spondylosis with radiculopathy, lumbar region: Secondary | ICD-10-CM | POA: Diagnosis not present

## 2017-11-10 ENCOUNTER — Encounter (INDEPENDENT_AMBULATORY_CARE_PROVIDER_SITE_OTHER): Payer: BLUE CROSS/BLUE SHIELD | Admitting: Ophthalmology

## 2017-11-10 DIAGNOSIS — H34832 Tributary (branch) retinal vein occlusion, left eye, with macular edema: Secondary | ICD-10-CM | POA: Diagnosis not present

## 2017-11-10 DIAGNOSIS — H35372 Puckering of macula, left eye: Secondary | ICD-10-CM

## 2017-11-10 DIAGNOSIS — H4313 Vitreous hemorrhage, bilateral: Secondary | ICD-10-CM

## 2017-11-10 DIAGNOSIS — H35033 Hypertensive retinopathy, bilateral: Secondary | ICD-10-CM

## 2017-11-10 DIAGNOSIS — D3132 Benign neoplasm of left choroid: Secondary | ICD-10-CM

## 2017-11-10 DIAGNOSIS — I10 Essential (primary) hypertension: Secondary | ICD-10-CM | POA: Diagnosis not present

## 2017-11-10 DIAGNOSIS — H353132 Nonexudative age-related macular degeneration, bilateral, intermediate dry stage: Secondary | ICD-10-CM

## 2017-12-06 DIAGNOSIS — I10 Essential (primary) hypertension: Secondary | ICD-10-CM | POA: Diagnosis not present

## 2017-12-06 DIAGNOSIS — E119 Type 2 diabetes mellitus without complications: Secondary | ICD-10-CM | POA: Diagnosis not present

## 2017-12-06 DIAGNOSIS — M519 Unspecified thoracic, thoracolumbar and lumbosacral intervertebral disc disorder: Secondary | ICD-10-CM | POA: Diagnosis not present

## 2017-12-08 ENCOUNTER — Encounter (INDEPENDENT_AMBULATORY_CARE_PROVIDER_SITE_OTHER): Payer: BLUE CROSS/BLUE SHIELD | Admitting: Ophthalmology

## 2017-12-08 DIAGNOSIS — H353132 Nonexudative age-related macular degeneration, bilateral, intermediate dry stage: Secondary | ICD-10-CM | POA: Diagnosis not present

## 2017-12-08 DIAGNOSIS — I10 Essential (primary) hypertension: Secondary | ICD-10-CM | POA: Diagnosis not present

## 2017-12-08 DIAGNOSIS — H34832 Tributary (branch) retinal vein occlusion, left eye, with macular edema: Secondary | ICD-10-CM

## 2017-12-08 DIAGNOSIS — H35033 Hypertensive retinopathy, bilateral: Secondary | ICD-10-CM

## 2017-12-08 DIAGNOSIS — H43813 Vitreous degeneration, bilateral: Secondary | ICD-10-CM

## 2017-12-08 DIAGNOSIS — D3132 Benign neoplasm of left choroid: Secondary | ICD-10-CM

## 2017-12-08 DIAGNOSIS — H35372 Puckering of macula, left eye: Secondary | ICD-10-CM

## 2017-12-28 DIAGNOSIS — H26491 Other secondary cataract, right eye: Secondary | ICD-10-CM | POA: Diagnosis not present

## 2017-12-28 DIAGNOSIS — H16223 Keratoconjunctivitis sicca, not specified as Sjogren's, bilateral: Secondary | ICD-10-CM | POA: Diagnosis not present

## 2017-12-28 DIAGNOSIS — H43392 Other vitreous opacities, left eye: Secondary | ICD-10-CM | POA: Diagnosis not present

## 2017-12-28 DIAGNOSIS — H353130 Nonexudative age-related macular degeneration, bilateral, stage unspecified: Secondary | ICD-10-CM | POA: Diagnosis not present

## 2018-01-05 ENCOUNTER — Encounter (INDEPENDENT_AMBULATORY_CARE_PROVIDER_SITE_OTHER): Payer: BLUE CROSS/BLUE SHIELD | Admitting: Ophthalmology

## 2018-01-05 DIAGNOSIS — D3132 Benign neoplasm of left choroid: Secondary | ICD-10-CM

## 2018-01-05 DIAGNOSIS — H35033 Hypertensive retinopathy, bilateral: Secondary | ICD-10-CM

## 2018-01-05 DIAGNOSIS — H35372 Puckering of macula, left eye: Secondary | ICD-10-CM

## 2018-01-05 DIAGNOSIS — H43813 Vitreous degeneration, bilateral: Secondary | ICD-10-CM

## 2018-01-05 DIAGNOSIS — H353132 Nonexudative age-related macular degeneration, bilateral, intermediate dry stage: Secondary | ICD-10-CM | POA: Diagnosis not present

## 2018-01-05 DIAGNOSIS — H34832 Tributary (branch) retinal vein occlusion, left eye, with macular edema: Secondary | ICD-10-CM

## 2018-01-05 DIAGNOSIS — I10 Essential (primary) hypertension: Secondary | ICD-10-CM

## 2018-01-06 DIAGNOSIS — M5136 Other intervertebral disc degeneration, lumbar region: Secondary | ICD-10-CM | POA: Diagnosis not present

## 2018-01-06 DIAGNOSIS — M48062 Spinal stenosis, lumbar region with neurogenic claudication: Secondary | ICD-10-CM | POA: Diagnosis not present

## 2018-01-06 DIAGNOSIS — M4316 Spondylolisthesis, lumbar region: Secondary | ICD-10-CM | POA: Diagnosis not present

## 2018-01-06 DIAGNOSIS — M47816 Spondylosis without myelopathy or radiculopathy, lumbar region: Secondary | ICD-10-CM | POA: Diagnosis not present

## 2018-01-30 DIAGNOSIS — H16223 Keratoconjunctivitis sicca, not specified as Sjogren's, bilateral: Secondary | ICD-10-CM | POA: Diagnosis not present

## 2018-01-30 DIAGNOSIS — H353130 Nonexudative age-related macular degeneration, bilateral, stage unspecified: Secondary | ICD-10-CM | POA: Diagnosis not present

## 2018-01-30 DIAGNOSIS — H43392 Other vitreous opacities, left eye: Secondary | ICD-10-CM | POA: Diagnosis not present

## 2018-01-30 DIAGNOSIS — Z961 Presence of intraocular lens: Secondary | ICD-10-CM | POA: Diagnosis not present

## 2018-02-03 ENCOUNTER — Encounter (INDEPENDENT_AMBULATORY_CARE_PROVIDER_SITE_OTHER): Payer: BLUE CROSS/BLUE SHIELD | Admitting: Ophthalmology

## 2018-02-03 DIAGNOSIS — H43813 Vitreous degeneration, bilateral: Secondary | ICD-10-CM

## 2018-02-03 DIAGNOSIS — H34832 Tributary (branch) retinal vein occlusion, left eye, with macular edema: Secondary | ICD-10-CM | POA: Diagnosis not present

## 2018-02-03 DIAGNOSIS — D3132 Benign neoplasm of left choroid: Secondary | ICD-10-CM

## 2018-02-03 DIAGNOSIS — H35033 Hypertensive retinopathy, bilateral: Secondary | ICD-10-CM | POA: Diagnosis not present

## 2018-02-03 DIAGNOSIS — I10 Essential (primary) hypertension: Secondary | ICD-10-CM | POA: Diagnosis not present

## 2018-02-03 DIAGNOSIS — H35372 Puckering of macula, left eye: Secondary | ICD-10-CM

## 2018-02-03 DIAGNOSIS — H353132 Nonexudative age-related macular degeneration, bilateral, intermediate dry stage: Secondary | ICD-10-CM

## 2018-02-09 DIAGNOSIS — M5136 Other intervertebral disc degeneration, lumbar region: Secondary | ICD-10-CM | POA: Diagnosis not present

## 2018-02-09 DIAGNOSIS — M4726 Other spondylosis with radiculopathy, lumbar region: Secondary | ICD-10-CM | POA: Diagnosis not present

## 2018-02-09 DIAGNOSIS — M48061 Spinal stenosis, lumbar region without neurogenic claudication: Secondary | ICD-10-CM | POA: Diagnosis not present

## 2018-03-09 DIAGNOSIS — N529 Male erectile dysfunction, unspecified: Secondary | ICD-10-CM | POA: Diagnosis not present

## 2018-03-09 DIAGNOSIS — I10 Essential (primary) hypertension: Secondary | ICD-10-CM | POA: Diagnosis not present

## 2018-03-09 DIAGNOSIS — E119 Type 2 diabetes mellitus without complications: Secondary | ICD-10-CM | POA: Diagnosis not present

## 2018-03-09 DIAGNOSIS — E781 Pure hyperglyceridemia: Secondary | ICD-10-CM | POA: Diagnosis not present

## 2018-03-10 ENCOUNTER — Encounter (INDEPENDENT_AMBULATORY_CARE_PROVIDER_SITE_OTHER): Payer: BLUE CROSS/BLUE SHIELD | Admitting: Ophthalmology

## 2018-03-10 DIAGNOSIS — D3132 Benign neoplasm of left choroid: Secondary | ICD-10-CM

## 2018-03-10 DIAGNOSIS — H353132 Nonexudative age-related macular degeneration, bilateral, intermediate dry stage: Secondary | ICD-10-CM

## 2018-03-10 DIAGNOSIS — H35033 Hypertensive retinopathy, bilateral: Secondary | ICD-10-CM

## 2018-03-10 DIAGNOSIS — I10 Essential (primary) hypertension: Secondary | ICD-10-CM

## 2018-03-10 DIAGNOSIS — H43813 Vitreous degeneration, bilateral: Secondary | ICD-10-CM

## 2018-03-10 DIAGNOSIS — H34832 Tributary (branch) retinal vein occlusion, left eye, with macular edema: Secondary | ICD-10-CM

## 2018-03-10 DIAGNOSIS — H35372 Puckering of macula, left eye: Secondary | ICD-10-CM

## 2018-03-16 DIAGNOSIS — M4807 Spinal stenosis, lumbosacral region: Secondary | ICD-10-CM | POA: Diagnosis not present

## 2018-03-22 ENCOUNTER — Encounter (INDEPENDENT_AMBULATORY_CARE_PROVIDER_SITE_OTHER): Payer: BLUE CROSS/BLUE SHIELD | Admitting: Ophthalmology

## 2018-03-22 DIAGNOSIS — H34832 Tributary (branch) retinal vein occlusion, left eye, with macular edema: Secondary | ICD-10-CM

## 2018-03-22 DIAGNOSIS — H43813 Vitreous degeneration, bilateral: Secondary | ICD-10-CM | POA: Diagnosis not present

## 2018-03-22 DIAGNOSIS — I10 Essential (primary) hypertension: Secondary | ICD-10-CM | POA: Diagnosis not present

## 2018-03-22 DIAGNOSIS — H35033 Hypertensive retinopathy, bilateral: Secondary | ICD-10-CM

## 2018-03-22 DIAGNOSIS — H353122 Nonexudative age-related macular degeneration, left eye, intermediate dry stage: Secondary | ICD-10-CM

## 2018-03-22 DIAGNOSIS — D3132 Benign neoplasm of left choroid: Secondary | ICD-10-CM

## 2018-04-21 ENCOUNTER — Encounter (INDEPENDENT_AMBULATORY_CARE_PROVIDER_SITE_OTHER): Payer: BLUE CROSS/BLUE SHIELD | Admitting: Ophthalmology

## 2018-04-21 DIAGNOSIS — H34832 Tributary (branch) retinal vein occlusion, left eye, with macular edema: Secondary | ICD-10-CM | POA: Diagnosis not present

## 2018-04-21 DIAGNOSIS — H353112 Nonexudative age-related macular degeneration, right eye, intermediate dry stage: Secondary | ICD-10-CM | POA: Diagnosis not present

## 2018-04-21 DIAGNOSIS — D3132 Benign neoplasm of left choroid: Secondary | ICD-10-CM

## 2018-04-21 DIAGNOSIS — H35033 Hypertensive retinopathy, bilateral: Secondary | ICD-10-CM

## 2018-04-21 DIAGNOSIS — H35372 Puckering of macula, left eye: Secondary | ICD-10-CM

## 2018-04-21 DIAGNOSIS — I10 Essential (primary) hypertension: Secondary | ICD-10-CM

## 2018-04-21 DIAGNOSIS — H43813 Vitreous degeneration, bilateral: Secondary | ICD-10-CM

## 2018-05-26 ENCOUNTER — Other Ambulatory Visit: Payer: Self-pay

## 2018-05-26 ENCOUNTER — Encounter (INDEPENDENT_AMBULATORY_CARE_PROVIDER_SITE_OTHER): Payer: BLUE CROSS/BLUE SHIELD | Admitting: Ophthalmology

## 2018-05-26 DIAGNOSIS — H34832 Tributary (branch) retinal vein occlusion, left eye, with macular edema: Secondary | ICD-10-CM

## 2018-05-26 DIAGNOSIS — H43813 Vitreous degeneration, bilateral: Secondary | ICD-10-CM

## 2018-05-26 DIAGNOSIS — H353132 Nonexudative age-related macular degeneration, bilateral, intermediate dry stage: Secondary | ICD-10-CM

## 2018-05-26 DIAGNOSIS — D3132 Benign neoplasm of left choroid: Secondary | ICD-10-CM

## 2018-05-26 DIAGNOSIS — H35033 Hypertensive retinopathy, bilateral: Secondary | ICD-10-CM | POA: Diagnosis not present

## 2018-05-26 DIAGNOSIS — I10 Essential (primary) hypertension: Secondary | ICD-10-CM

## 2018-05-26 DIAGNOSIS — H35372 Puckering of macula, left eye: Secondary | ICD-10-CM

## 2018-06-23 DIAGNOSIS — M4726 Other spondylosis with radiculopathy, lumbar region: Secondary | ICD-10-CM | POA: Diagnosis not present

## 2018-06-23 DIAGNOSIS — M48061 Spinal stenosis, lumbar region without neurogenic claudication: Secondary | ICD-10-CM | POA: Diagnosis not present

## 2018-06-23 DIAGNOSIS — M5136 Other intervertebral disc degeneration, lumbar region: Secondary | ICD-10-CM | POA: Diagnosis not present

## 2018-07-10 ENCOUNTER — Other Ambulatory Visit: Payer: Self-pay

## 2018-07-10 ENCOUNTER — Encounter (INDEPENDENT_AMBULATORY_CARE_PROVIDER_SITE_OTHER): Payer: BLUE CROSS/BLUE SHIELD | Admitting: Ophthalmology

## 2018-07-10 DIAGNOSIS — D3132 Benign neoplasm of left choroid: Secondary | ICD-10-CM

## 2018-07-10 DIAGNOSIS — H353132 Nonexudative age-related macular degeneration, bilateral, intermediate dry stage: Secondary | ICD-10-CM | POA: Diagnosis not present

## 2018-07-10 DIAGNOSIS — H34832 Tributary (branch) retinal vein occlusion, left eye, with macular edema: Secondary | ICD-10-CM

## 2018-07-10 DIAGNOSIS — H43813 Vitreous degeneration, bilateral: Secondary | ICD-10-CM

## 2018-07-10 DIAGNOSIS — H35033 Hypertensive retinopathy, bilateral: Secondary | ICD-10-CM

## 2018-07-10 DIAGNOSIS — I10 Essential (primary) hypertension: Secondary | ICD-10-CM | POA: Diagnosis not present

## 2018-08-11 ENCOUNTER — Encounter (INDEPENDENT_AMBULATORY_CARE_PROVIDER_SITE_OTHER): Payer: BLUE CROSS/BLUE SHIELD | Admitting: Ophthalmology

## 2018-08-11 ENCOUNTER — Other Ambulatory Visit: Payer: Self-pay

## 2018-08-11 DIAGNOSIS — H35372 Puckering of macula, left eye: Secondary | ICD-10-CM

## 2018-08-11 DIAGNOSIS — H35033 Hypertensive retinopathy, bilateral: Secondary | ICD-10-CM | POA: Diagnosis not present

## 2018-08-11 DIAGNOSIS — D3132 Benign neoplasm of left choroid: Secondary | ICD-10-CM

## 2018-08-11 DIAGNOSIS — H353132 Nonexudative age-related macular degeneration, bilateral, intermediate dry stage: Secondary | ICD-10-CM

## 2018-08-11 DIAGNOSIS — H34832 Tributary (branch) retinal vein occlusion, left eye, with macular edema: Secondary | ICD-10-CM | POA: Diagnosis not present

## 2018-08-11 DIAGNOSIS — H43813 Vitreous degeneration, bilateral: Secondary | ICD-10-CM

## 2018-08-11 DIAGNOSIS — I10 Essential (primary) hypertension: Secondary | ICD-10-CM | POA: Diagnosis not present

## 2018-08-15 DIAGNOSIS — I872 Venous insufficiency (chronic) (peripheral): Secondary | ICD-10-CM | POA: Diagnosis not present

## 2018-08-28 DIAGNOSIS — M1711 Unilateral primary osteoarthritis, right knee: Secondary | ICD-10-CM | POA: Diagnosis not present

## 2018-09-06 DIAGNOSIS — Z961 Presence of intraocular lens: Secondary | ICD-10-CM | POA: Diagnosis not present

## 2018-09-06 DIAGNOSIS — H43392 Other vitreous opacities, left eye: Secondary | ICD-10-CM | POA: Diagnosis not present

## 2018-09-06 DIAGNOSIS — H16223 Keratoconjunctivitis sicca, not specified as Sjogren's, bilateral: Secondary | ICD-10-CM | POA: Diagnosis not present

## 2018-09-06 DIAGNOSIS — H353130 Nonexudative age-related macular degeneration, bilateral, stage unspecified: Secondary | ICD-10-CM | POA: Diagnosis not present

## 2018-09-11 DIAGNOSIS — M1711 Unilateral primary osteoarthritis, right knee: Secondary | ICD-10-CM | POA: Diagnosis not present

## 2018-09-12 DIAGNOSIS — E781 Pure hyperglyceridemia: Secondary | ICD-10-CM | POA: Diagnosis not present

## 2018-09-12 DIAGNOSIS — N529 Male erectile dysfunction, unspecified: Secondary | ICD-10-CM | POA: Diagnosis not present

## 2018-09-12 DIAGNOSIS — E1169 Type 2 diabetes mellitus with other specified complication: Secondary | ICD-10-CM | POA: Diagnosis not present

## 2018-09-12 DIAGNOSIS — I10 Essential (primary) hypertension: Secondary | ICD-10-CM | POA: Diagnosis not present

## 2018-09-14 DIAGNOSIS — E781 Pure hyperglyceridemia: Secondary | ICD-10-CM | POA: Diagnosis not present

## 2018-09-14 DIAGNOSIS — I10 Essential (primary) hypertension: Secondary | ICD-10-CM | POA: Diagnosis not present

## 2018-09-14 DIAGNOSIS — E1169 Type 2 diabetes mellitus with other specified complication: Secondary | ICD-10-CM | POA: Diagnosis not present

## 2018-09-15 ENCOUNTER — Encounter (INDEPENDENT_AMBULATORY_CARE_PROVIDER_SITE_OTHER): Payer: BC Managed Care – PPO | Admitting: Ophthalmology

## 2018-09-15 DIAGNOSIS — I10 Essential (primary) hypertension: Secondary | ICD-10-CM | POA: Diagnosis not present

## 2018-09-15 DIAGNOSIS — H353132 Nonexudative age-related macular degeneration, bilateral, intermediate dry stage: Secondary | ICD-10-CM

## 2018-09-15 DIAGNOSIS — H34832 Tributary (branch) retinal vein occlusion, left eye, with macular edema: Secondary | ICD-10-CM

## 2018-09-15 DIAGNOSIS — H35033 Hypertensive retinopathy, bilateral: Secondary | ICD-10-CM

## 2018-09-15 DIAGNOSIS — D3132 Benign neoplasm of left choroid: Secondary | ICD-10-CM

## 2018-09-15 DIAGNOSIS — H43813 Vitreous degeneration, bilateral: Secondary | ICD-10-CM

## 2018-09-15 DIAGNOSIS — H35372 Puckering of macula, left eye: Secondary | ICD-10-CM

## 2018-10-10 DIAGNOSIS — M4316 Spondylolisthesis, lumbar region: Secondary | ICD-10-CM | POA: Diagnosis not present

## 2018-10-10 DIAGNOSIS — M47816 Spondylosis without myelopathy or radiculopathy, lumbar region: Secondary | ICD-10-CM | POA: Diagnosis not present

## 2018-10-10 DIAGNOSIS — M48062 Spinal stenosis, lumbar region with neurogenic claudication: Secondary | ICD-10-CM | POA: Diagnosis not present

## 2018-10-10 DIAGNOSIS — M5136 Other intervertebral disc degeneration, lumbar region: Secondary | ICD-10-CM | POA: Diagnosis not present

## 2018-10-23 ENCOUNTER — Other Ambulatory Visit: Payer: Self-pay

## 2018-10-23 ENCOUNTER — Encounter (INDEPENDENT_AMBULATORY_CARE_PROVIDER_SITE_OTHER): Payer: BC Managed Care – PPO | Admitting: Ophthalmology

## 2018-10-23 DIAGNOSIS — D3132 Benign neoplasm of left choroid: Secondary | ICD-10-CM

## 2018-10-23 DIAGNOSIS — H43813 Vitreous degeneration, bilateral: Secondary | ICD-10-CM

## 2018-10-23 DIAGNOSIS — H353132 Nonexudative age-related macular degeneration, bilateral, intermediate dry stage: Secondary | ICD-10-CM

## 2018-10-23 DIAGNOSIS — I10 Essential (primary) hypertension: Secondary | ICD-10-CM

## 2018-10-23 DIAGNOSIS — H35372 Puckering of macula, left eye: Secondary | ICD-10-CM

## 2018-10-23 DIAGNOSIS — H34832 Tributary (branch) retinal vein occlusion, left eye, with macular edema: Secondary | ICD-10-CM | POA: Diagnosis not present

## 2018-10-23 DIAGNOSIS — H35033 Hypertensive retinopathy, bilateral: Secondary | ICD-10-CM

## 2018-10-26 DIAGNOSIS — M48061 Spinal stenosis, lumbar region without neurogenic claudication: Secondary | ICD-10-CM | POA: Diagnosis not present

## 2018-10-26 DIAGNOSIS — M5136 Other intervertebral disc degeneration, lumbar region: Secondary | ICD-10-CM | POA: Diagnosis not present

## 2018-10-26 DIAGNOSIS — M4726 Other spondylosis with radiculopathy, lumbar region: Secondary | ICD-10-CM | POA: Diagnosis not present

## 2018-12-04 ENCOUNTER — Encounter (INDEPENDENT_AMBULATORY_CARE_PROVIDER_SITE_OTHER): Payer: BC Managed Care – PPO | Admitting: Ophthalmology

## 2018-12-04 ENCOUNTER — Other Ambulatory Visit: Payer: Self-pay

## 2018-12-04 DIAGNOSIS — D3132 Benign neoplasm of left choroid: Secondary | ICD-10-CM

## 2018-12-04 DIAGNOSIS — H35372 Puckering of macula, left eye: Secondary | ICD-10-CM

## 2018-12-04 DIAGNOSIS — H34832 Tributary (branch) retinal vein occlusion, left eye, with macular edema: Secondary | ICD-10-CM

## 2018-12-04 DIAGNOSIS — H43813 Vitreous degeneration, bilateral: Secondary | ICD-10-CM

## 2018-12-04 DIAGNOSIS — I10 Essential (primary) hypertension: Secondary | ICD-10-CM

## 2018-12-04 DIAGNOSIS — H35033 Hypertensive retinopathy, bilateral: Secondary | ICD-10-CM

## 2019-01-05 DIAGNOSIS — E1169 Type 2 diabetes mellitus with other specified complication: Secondary | ICD-10-CM | POA: Diagnosis not present

## 2019-01-08 ENCOUNTER — Encounter (INDEPENDENT_AMBULATORY_CARE_PROVIDER_SITE_OTHER): Payer: BC Managed Care – PPO | Admitting: Ophthalmology

## 2019-01-08 ENCOUNTER — Other Ambulatory Visit: Payer: Self-pay

## 2019-01-08 DIAGNOSIS — H35372 Puckering of macula, left eye: Secondary | ICD-10-CM

## 2019-01-08 DIAGNOSIS — H353132 Nonexudative age-related macular degeneration, bilateral, intermediate dry stage: Secondary | ICD-10-CM | POA: Diagnosis not present

## 2019-01-08 DIAGNOSIS — D3132 Benign neoplasm of left choroid: Secondary | ICD-10-CM

## 2019-01-08 DIAGNOSIS — I10 Essential (primary) hypertension: Secondary | ICD-10-CM

## 2019-01-08 DIAGNOSIS — H35033 Hypertensive retinopathy, bilateral: Secondary | ICD-10-CM | POA: Diagnosis not present

## 2019-01-08 DIAGNOSIS — H43813 Vitreous degeneration, bilateral: Secondary | ICD-10-CM

## 2019-01-08 DIAGNOSIS — H34832 Tributary (branch) retinal vein occlusion, left eye, with macular edema: Secondary | ICD-10-CM

## 2019-01-11 DIAGNOSIS — E1169 Type 2 diabetes mellitus with other specified complication: Secondary | ICD-10-CM | POA: Diagnosis not present

## 2019-01-28 DIAGNOSIS — Z20828 Contact with and (suspected) exposure to other viral communicable diseases: Secondary | ICD-10-CM | POA: Diagnosis not present

## 2019-02-07 DIAGNOSIS — H16223 Keratoconjunctivitis sicca, not specified as Sjogren's, bilateral: Secondary | ICD-10-CM | POA: Diagnosis not present

## 2019-02-07 DIAGNOSIS — H353130 Nonexudative age-related macular degeneration, bilateral, stage unspecified: Secondary | ICD-10-CM | POA: Diagnosis not present

## 2019-02-07 DIAGNOSIS — Z961 Presence of intraocular lens: Secondary | ICD-10-CM | POA: Diagnosis not present

## 2019-02-07 DIAGNOSIS — H43392 Other vitreous opacities, left eye: Secondary | ICD-10-CM | POA: Diagnosis not present

## 2019-02-09 DIAGNOSIS — M47816 Spondylosis without myelopathy or radiculopathy, lumbar region: Secondary | ICD-10-CM | POA: Diagnosis not present

## 2019-02-09 DIAGNOSIS — M4316 Spondylolisthesis, lumbar region: Secondary | ICD-10-CM | POA: Diagnosis not present

## 2019-02-09 DIAGNOSIS — M5136 Other intervertebral disc degeneration, lumbar region: Secondary | ICD-10-CM | POA: Diagnosis not present

## 2019-02-09 DIAGNOSIS — M48062 Spinal stenosis, lumbar region with neurogenic claudication: Secondary | ICD-10-CM | POA: Diagnosis not present

## 2019-02-12 ENCOUNTER — Encounter (INDEPENDENT_AMBULATORY_CARE_PROVIDER_SITE_OTHER): Payer: BC Managed Care – PPO | Admitting: Ophthalmology

## 2019-02-12 DIAGNOSIS — H353132 Nonexudative age-related macular degeneration, bilateral, intermediate dry stage: Secondary | ICD-10-CM

## 2019-02-12 DIAGNOSIS — M4726 Other spondylosis with radiculopathy, lumbar region: Secondary | ICD-10-CM | POA: Diagnosis not present

## 2019-02-12 DIAGNOSIS — I10 Essential (primary) hypertension: Secondary | ICD-10-CM | POA: Diagnosis not present

## 2019-02-12 DIAGNOSIS — H34832 Tributary (branch) retinal vein occlusion, left eye, with macular edema: Secondary | ICD-10-CM | POA: Diagnosis not present

## 2019-02-12 DIAGNOSIS — H43813 Vitreous degeneration, bilateral: Secondary | ICD-10-CM

## 2019-02-12 DIAGNOSIS — M5136 Other intervertebral disc degeneration, lumbar region: Secondary | ICD-10-CM | POA: Diagnosis not present

## 2019-02-12 DIAGNOSIS — M48061 Spinal stenosis, lumbar region without neurogenic claudication: Secondary | ICD-10-CM | POA: Diagnosis not present

## 2019-02-12 DIAGNOSIS — H35033 Hypertensive retinopathy, bilateral: Secondary | ICD-10-CM | POA: Diagnosis not present

## 2019-02-12 DIAGNOSIS — D3132 Benign neoplasm of left choroid: Secondary | ICD-10-CM

## 2019-03-19 ENCOUNTER — Encounter (INDEPENDENT_AMBULATORY_CARE_PROVIDER_SITE_OTHER): Payer: BC Managed Care – PPO | Admitting: Ophthalmology

## 2019-03-19 DIAGNOSIS — H353132 Nonexudative age-related macular degeneration, bilateral, intermediate dry stage: Secondary | ICD-10-CM

## 2019-03-19 DIAGNOSIS — H34832 Tributary (branch) retinal vein occlusion, left eye, with macular edema: Secondary | ICD-10-CM

## 2019-03-19 DIAGNOSIS — D3132 Benign neoplasm of left choroid: Secondary | ICD-10-CM

## 2019-03-19 DIAGNOSIS — H43813 Vitreous degeneration, bilateral: Secondary | ICD-10-CM

## 2019-03-19 DIAGNOSIS — I10 Essential (primary) hypertension: Secondary | ICD-10-CM

## 2019-03-19 DIAGNOSIS — H35033 Hypertensive retinopathy, bilateral: Secondary | ICD-10-CM | POA: Diagnosis not present

## 2019-04-18 DIAGNOSIS — E781 Pure hyperglyceridemia: Secondary | ICD-10-CM | POA: Diagnosis not present

## 2019-04-18 DIAGNOSIS — E1169 Type 2 diabetes mellitus with other specified complication: Secondary | ICD-10-CM | POA: Diagnosis not present

## 2019-04-18 DIAGNOSIS — I10 Essential (primary) hypertension: Secondary | ICD-10-CM | POA: Diagnosis not present

## 2019-04-18 DIAGNOSIS — Z125 Encounter for screening for malignant neoplasm of prostate: Secondary | ICD-10-CM | POA: Diagnosis not present

## 2019-04-23 ENCOUNTER — Encounter (INDEPENDENT_AMBULATORY_CARE_PROVIDER_SITE_OTHER): Payer: BC Managed Care – PPO | Admitting: Ophthalmology

## 2019-04-23 ENCOUNTER — Other Ambulatory Visit: Payer: Self-pay

## 2019-04-23 DIAGNOSIS — H4313 Vitreous hemorrhage, bilateral: Secondary | ICD-10-CM

## 2019-04-23 DIAGNOSIS — H34832 Tributary (branch) retinal vein occlusion, left eye, with macular edema: Secondary | ICD-10-CM | POA: Diagnosis not present

## 2019-04-23 DIAGNOSIS — H353132 Nonexudative age-related macular degeneration, bilateral, intermediate dry stage: Secondary | ICD-10-CM

## 2019-04-23 DIAGNOSIS — H35033 Hypertensive retinopathy, bilateral: Secondary | ICD-10-CM

## 2019-04-23 DIAGNOSIS — I10 Essential (primary) hypertension: Secondary | ICD-10-CM | POA: Diagnosis not present

## 2019-04-23 DIAGNOSIS — D3132 Benign neoplasm of left choroid: Secondary | ICD-10-CM

## 2019-05-28 ENCOUNTER — Encounter (INDEPENDENT_AMBULATORY_CARE_PROVIDER_SITE_OTHER): Payer: BC Managed Care – PPO | Admitting: Ophthalmology

## 2019-05-28 ENCOUNTER — Other Ambulatory Visit: Payer: Self-pay

## 2019-05-28 DIAGNOSIS — H353132 Nonexudative age-related macular degeneration, bilateral, intermediate dry stage: Secondary | ICD-10-CM | POA: Diagnosis not present

## 2019-05-28 DIAGNOSIS — H35033 Hypertensive retinopathy, bilateral: Secondary | ICD-10-CM

## 2019-05-28 DIAGNOSIS — I10 Essential (primary) hypertension: Secondary | ICD-10-CM

## 2019-05-28 DIAGNOSIS — H43813 Vitreous degeneration, bilateral: Secondary | ICD-10-CM

## 2019-05-28 DIAGNOSIS — H34832 Tributary (branch) retinal vein occlusion, left eye, with macular edema: Secondary | ICD-10-CM | POA: Diagnosis not present

## 2019-05-28 DIAGNOSIS — D3132 Benign neoplasm of left choroid: Secondary | ICD-10-CM

## 2019-05-30 DIAGNOSIS — M48062 Spinal stenosis, lumbar region with neurogenic claudication: Secondary | ICD-10-CM | POA: Diagnosis not present

## 2019-05-30 DIAGNOSIS — M47816 Spondylosis without myelopathy or radiculopathy, lumbar region: Secondary | ICD-10-CM | POA: Diagnosis not present

## 2019-05-30 DIAGNOSIS — M4316 Spondylolisthesis, lumbar region: Secondary | ICD-10-CM | POA: Diagnosis not present

## 2019-05-30 DIAGNOSIS — M5136 Other intervertebral disc degeneration, lumbar region: Secondary | ICD-10-CM | POA: Diagnosis not present

## 2019-06-14 DIAGNOSIS — M4726 Other spondylosis with radiculopathy, lumbar region: Secondary | ICD-10-CM | POA: Diagnosis not present

## 2019-06-14 DIAGNOSIS — M48061 Spinal stenosis, lumbar region without neurogenic claudication: Secondary | ICD-10-CM | POA: Diagnosis not present

## 2019-06-14 DIAGNOSIS — M5136 Other intervertebral disc degeneration, lumbar region: Secondary | ICD-10-CM | POA: Diagnosis not present

## 2019-07-02 ENCOUNTER — Other Ambulatory Visit: Payer: Self-pay

## 2019-07-02 ENCOUNTER — Encounter (INDEPENDENT_AMBULATORY_CARE_PROVIDER_SITE_OTHER): Payer: BC Managed Care – PPO | Admitting: Ophthalmology

## 2019-07-02 DIAGNOSIS — I10 Essential (primary) hypertension: Secondary | ICD-10-CM | POA: Diagnosis not present

## 2019-07-02 DIAGNOSIS — H353132 Nonexudative age-related macular degeneration, bilateral, intermediate dry stage: Secondary | ICD-10-CM

## 2019-07-02 DIAGNOSIS — H34832 Tributary (branch) retinal vein occlusion, left eye, with macular edema: Secondary | ICD-10-CM

## 2019-07-02 DIAGNOSIS — H43813 Vitreous degeneration, bilateral: Secondary | ICD-10-CM

## 2019-07-02 DIAGNOSIS — H35033 Hypertensive retinopathy, bilateral: Secondary | ICD-10-CM

## 2019-07-02 DIAGNOSIS — H35372 Puckering of macula, left eye: Secondary | ICD-10-CM

## 2019-07-02 DIAGNOSIS — D3132 Benign neoplasm of left choroid: Secondary | ICD-10-CM

## 2019-08-06 ENCOUNTER — Other Ambulatory Visit: Payer: Self-pay

## 2019-08-06 ENCOUNTER — Encounter (INDEPENDENT_AMBULATORY_CARE_PROVIDER_SITE_OTHER): Payer: BC Managed Care – PPO | Admitting: Ophthalmology

## 2019-08-06 DIAGNOSIS — H43813 Vitreous degeneration, bilateral: Secondary | ICD-10-CM

## 2019-08-06 DIAGNOSIS — H35033 Hypertensive retinopathy, bilateral: Secondary | ICD-10-CM | POA: Diagnosis not present

## 2019-08-06 DIAGNOSIS — I10 Essential (primary) hypertension: Secondary | ICD-10-CM | POA: Diagnosis not present

## 2019-08-06 DIAGNOSIS — H35372 Puckering of macula, left eye: Secondary | ICD-10-CM

## 2019-08-06 DIAGNOSIS — D3132 Benign neoplasm of left choroid: Secondary | ICD-10-CM

## 2019-08-06 DIAGNOSIS — H34832 Tributary (branch) retinal vein occlusion, left eye, with macular edema: Secondary | ICD-10-CM | POA: Diagnosis not present

## 2019-08-06 DIAGNOSIS — H353132 Nonexudative age-related macular degeneration, bilateral, intermediate dry stage: Secondary | ICD-10-CM

## 2019-08-14 DIAGNOSIS — H9313 Tinnitus, bilateral: Secondary | ICD-10-CM | POA: Diagnosis not present

## 2019-08-14 DIAGNOSIS — H9041 Sensorineural hearing loss, unilateral, right ear, with unrestricted hearing on the contralateral side: Secondary | ICD-10-CM | POA: Diagnosis not present

## 2019-08-14 DIAGNOSIS — H903 Sensorineural hearing loss, bilateral: Secondary | ICD-10-CM | POA: Diagnosis not present

## 2019-09-12 ENCOUNTER — Encounter (INDEPENDENT_AMBULATORY_CARE_PROVIDER_SITE_OTHER): Payer: BC Managed Care – PPO | Admitting: Ophthalmology

## 2019-09-14 ENCOUNTER — Other Ambulatory Visit: Payer: Self-pay

## 2019-09-14 ENCOUNTER — Encounter (INDEPENDENT_AMBULATORY_CARE_PROVIDER_SITE_OTHER): Payer: Medicare Other | Admitting: Ophthalmology

## 2019-09-14 DIAGNOSIS — I10 Essential (primary) hypertension: Secondary | ICD-10-CM | POA: Diagnosis not present

## 2019-09-14 DIAGNOSIS — H353132 Nonexudative age-related macular degeneration, bilateral, intermediate dry stage: Secondary | ICD-10-CM

## 2019-09-14 DIAGNOSIS — H35372 Puckering of macula, left eye: Secondary | ICD-10-CM

## 2019-09-14 DIAGNOSIS — D3132 Benign neoplasm of left choroid: Secondary | ICD-10-CM

## 2019-09-14 DIAGNOSIS — H34832 Tributary (branch) retinal vein occlusion, left eye, with macular edema: Secondary | ICD-10-CM | POA: Diagnosis not present

## 2019-09-14 DIAGNOSIS — H35033 Hypertensive retinopathy, bilateral: Secondary | ICD-10-CM

## 2019-09-14 DIAGNOSIS — H43813 Vitreous degeneration, bilateral: Secondary | ICD-10-CM

## 2019-10-19 ENCOUNTER — Encounter (INDEPENDENT_AMBULATORY_CARE_PROVIDER_SITE_OTHER): Payer: Medicare Other | Admitting: Ophthalmology

## 2019-10-19 ENCOUNTER — Other Ambulatory Visit: Payer: Self-pay

## 2019-10-19 DIAGNOSIS — H34832 Tributary (branch) retinal vein occlusion, left eye, with macular edema: Secondary | ICD-10-CM

## 2019-10-19 DIAGNOSIS — D3132 Benign neoplasm of left choroid: Secondary | ICD-10-CM

## 2019-10-19 DIAGNOSIS — H35372 Puckering of macula, left eye: Secondary | ICD-10-CM

## 2019-10-19 DIAGNOSIS — H353132 Nonexudative age-related macular degeneration, bilateral, intermediate dry stage: Secondary | ICD-10-CM

## 2019-10-19 DIAGNOSIS — H43813 Vitreous degeneration, bilateral: Secondary | ICD-10-CM

## 2019-10-19 DIAGNOSIS — H35033 Hypertensive retinopathy, bilateral: Secondary | ICD-10-CM | POA: Diagnosis not present

## 2019-10-19 DIAGNOSIS — I10 Essential (primary) hypertension: Secondary | ICD-10-CM

## 2019-11-23 ENCOUNTER — Other Ambulatory Visit: Payer: Self-pay

## 2019-11-23 ENCOUNTER — Encounter (INDEPENDENT_AMBULATORY_CARE_PROVIDER_SITE_OTHER): Payer: Medicare Other | Admitting: Ophthalmology

## 2019-11-23 DIAGNOSIS — I10 Essential (primary) hypertension: Secondary | ICD-10-CM | POA: Diagnosis not present

## 2019-11-23 DIAGNOSIS — H34832 Tributary (branch) retinal vein occlusion, left eye, with macular edema: Secondary | ICD-10-CM

## 2019-11-23 DIAGNOSIS — H43813 Vitreous degeneration, bilateral: Secondary | ICD-10-CM

## 2019-11-23 DIAGNOSIS — H353132 Nonexudative age-related macular degeneration, bilateral, intermediate dry stage: Secondary | ICD-10-CM | POA: Diagnosis not present

## 2019-11-23 DIAGNOSIS — H35372 Puckering of macula, left eye: Secondary | ICD-10-CM

## 2019-11-23 DIAGNOSIS — H35033 Hypertensive retinopathy, bilateral: Secondary | ICD-10-CM

## 2019-12-28 ENCOUNTER — Encounter (INDEPENDENT_AMBULATORY_CARE_PROVIDER_SITE_OTHER): Payer: Medicare Other | Admitting: Ophthalmology

## 2019-12-28 ENCOUNTER — Other Ambulatory Visit: Payer: Self-pay

## 2019-12-28 DIAGNOSIS — H43813 Vitreous degeneration, bilateral: Secondary | ICD-10-CM

## 2019-12-28 DIAGNOSIS — H34832 Tributary (branch) retinal vein occlusion, left eye, with macular edema: Secondary | ICD-10-CM | POA: Diagnosis not present

## 2019-12-28 DIAGNOSIS — H353132 Nonexudative age-related macular degeneration, bilateral, intermediate dry stage: Secondary | ICD-10-CM | POA: Diagnosis not present

## 2019-12-28 DIAGNOSIS — D3132 Benign neoplasm of left choroid: Secondary | ICD-10-CM

## 2019-12-28 DIAGNOSIS — H35033 Hypertensive retinopathy, bilateral: Secondary | ICD-10-CM

## 2019-12-28 DIAGNOSIS — H35372 Puckering of macula, left eye: Secondary | ICD-10-CM

## 2019-12-28 DIAGNOSIS — I10 Essential (primary) hypertension: Secondary | ICD-10-CM | POA: Diagnosis not present

## 2020-02-01 ENCOUNTER — Other Ambulatory Visit: Payer: Self-pay

## 2020-02-01 ENCOUNTER — Encounter (INDEPENDENT_AMBULATORY_CARE_PROVIDER_SITE_OTHER): Payer: Medicare Other | Admitting: Ophthalmology

## 2020-02-01 DIAGNOSIS — H35033 Hypertensive retinopathy, bilateral: Secondary | ICD-10-CM

## 2020-02-01 DIAGNOSIS — D3132 Benign neoplasm of left choroid: Secondary | ICD-10-CM

## 2020-02-01 DIAGNOSIS — H34832 Tributary (branch) retinal vein occlusion, left eye, with macular edema: Secondary | ICD-10-CM | POA: Diagnosis not present

## 2020-02-01 DIAGNOSIS — I10 Essential (primary) hypertension: Secondary | ICD-10-CM

## 2020-02-01 DIAGNOSIS — H353132 Nonexudative age-related macular degeneration, bilateral, intermediate dry stage: Secondary | ICD-10-CM

## 2020-02-01 DIAGNOSIS — H43813 Vitreous degeneration, bilateral: Secondary | ICD-10-CM

## 2020-02-01 DIAGNOSIS — H35372 Puckering of macula, left eye: Secondary | ICD-10-CM

## 2020-03-07 ENCOUNTER — Encounter (INDEPENDENT_AMBULATORY_CARE_PROVIDER_SITE_OTHER): Payer: Medicare Other | Admitting: Ophthalmology

## 2020-03-07 ENCOUNTER — Other Ambulatory Visit: Payer: Self-pay

## 2020-03-07 DIAGNOSIS — H353132 Nonexudative age-related macular degeneration, bilateral, intermediate dry stage: Secondary | ICD-10-CM | POA: Diagnosis not present

## 2020-03-07 DIAGNOSIS — I10 Essential (primary) hypertension: Secondary | ICD-10-CM | POA: Diagnosis not present

## 2020-03-07 DIAGNOSIS — H34832 Tributary (branch) retinal vein occlusion, left eye, with macular edema: Secondary | ICD-10-CM

## 2020-03-07 DIAGNOSIS — H43813 Vitreous degeneration, bilateral: Secondary | ICD-10-CM

## 2020-03-07 DIAGNOSIS — H35033 Hypertensive retinopathy, bilateral: Secondary | ICD-10-CM

## 2020-03-07 DIAGNOSIS — D3132 Benign neoplasm of left choroid: Secondary | ICD-10-CM

## 2020-04-18 ENCOUNTER — Encounter (INDEPENDENT_AMBULATORY_CARE_PROVIDER_SITE_OTHER): Payer: Medicare Other | Admitting: Ophthalmology

## 2020-04-18 ENCOUNTER — Other Ambulatory Visit: Payer: Self-pay

## 2020-04-18 DIAGNOSIS — H35033 Hypertensive retinopathy, bilateral: Secondary | ICD-10-CM

## 2020-04-18 DIAGNOSIS — H34832 Tributary (branch) retinal vein occlusion, left eye, with macular edema: Secondary | ICD-10-CM | POA: Diagnosis not present

## 2020-04-18 DIAGNOSIS — H353132 Nonexudative age-related macular degeneration, bilateral, intermediate dry stage: Secondary | ICD-10-CM

## 2020-04-18 DIAGNOSIS — I10 Essential (primary) hypertension: Secondary | ICD-10-CM

## 2020-04-18 DIAGNOSIS — H43813 Vitreous degeneration, bilateral: Secondary | ICD-10-CM

## 2020-04-18 DIAGNOSIS — H35372 Puckering of macula, left eye: Secondary | ICD-10-CM

## 2020-05-30 ENCOUNTER — Encounter (INDEPENDENT_AMBULATORY_CARE_PROVIDER_SITE_OTHER): Payer: Medicare Other | Admitting: Ophthalmology

## 2020-05-30 ENCOUNTER — Other Ambulatory Visit: Payer: Self-pay

## 2020-05-30 DIAGNOSIS — I10 Essential (primary) hypertension: Secondary | ICD-10-CM

## 2020-05-30 DIAGNOSIS — H34832 Tributary (branch) retinal vein occlusion, left eye, with macular edema: Secondary | ICD-10-CM | POA: Diagnosis not present

## 2020-05-30 DIAGNOSIS — H35033 Hypertensive retinopathy, bilateral: Secondary | ICD-10-CM

## 2020-05-30 DIAGNOSIS — H353132 Nonexudative age-related macular degeneration, bilateral, intermediate dry stage: Secondary | ICD-10-CM

## 2020-05-30 DIAGNOSIS — H43813 Vitreous degeneration, bilateral: Secondary | ICD-10-CM

## 2020-05-30 DIAGNOSIS — H35372 Puckering of macula, left eye: Secondary | ICD-10-CM

## 2020-07-11 ENCOUNTER — Other Ambulatory Visit: Payer: Self-pay

## 2020-07-11 ENCOUNTER — Encounter (INDEPENDENT_AMBULATORY_CARE_PROVIDER_SITE_OTHER): Payer: Medicare Other | Admitting: Ophthalmology

## 2020-07-11 DIAGNOSIS — H43813 Vitreous degeneration, bilateral: Secondary | ICD-10-CM

## 2020-07-11 DIAGNOSIS — H34832 Tributary (branch) retinal vein occlusion, left eye, with macular edema: Secondary | ICD-10-CM | POA: Diagnosis not present

## 2020-07-11 DIAGNOSIS — H35033 Hypertensive retinopathy, bilateral: Secondary | ICD-10-CM | POA: Diagnosis not present

## 2020-07-11 DIAGNOSIS — H35372 Puckering of macula, left eye: Secondary | ICD-10-CM

## 2020-07-11 DIAGNOSIS — H353132 Nonexudative age-related macular degeneration, bilateral, intermediate dry stage: Secondary | ICD-10-CM

## 2020-07-11 DIAGNOSIS — I10 Essential (primary) hypertension: Secondary | ICD-10-CM | POA: Diagnosis not present

## 2020-08-22 ENCOUNTER — Encounter (INDEPENDENT_AMBULATORY_CARE_PROVIDER_SITE_OTHER): Payer: Medicare Other | Admitting: Ophthalmology

## 2020-08-22 ENCOUNTER — Other Ambulatory Visit: Payer: Self-pay

## 2020-08-22 DIAGNOSIS — H353132 Nonexudative age-related macular degeneration, bilateral, intermediate dry stage: Secondary | ICD-10-CM | POA: Diagnosis not present

## 2020-08-22 DIAGNOSIS — I10 Essential (primary) hypertension: Secondary | ICD-10-CM

## 2020-08-22 DIAGNOSIS — H35372 Puckering of macula, left eye: Secondary | ICD-10-CM

## 2020-08-22 DIAGNOSIS — H35033 Hypertensive retinopathy, bilateral: Secondary | ICD-10-CM | POA: Diagnosis not present

## 2020-08-22 DIAGNOSIS — H34832 Tributary (branch) retinal vein occlusion, left eye, with macular edema: Secondary | ICD-10-CM

## 2020-08-22 DIAGNOSIS — H43813 Vitreous degeneration, bilateral: Secondary | ICD-10-CM

## 2020-10-01 ENCOUNTER — Ambulatory Visit
Admission: RE | Admit: 2020-10-01 | Discharge: 2020-10-01 | Disposition: A | Discharge status: Home / Self Care | Payer: BLUE CROSS/BLUE SHIELD | Source: Ambulatory Visit | Attending: Physician Assistant | Admitting: Physician Assistant

## 2020-10-01 ENCOUNTER — Other Ambulatory Visit: Payer: Self-pay | Admitting: Physician Assistant

## 2020-10-01 ENCOUNTER — Other Ambulatory Visit: Payer: Self-pay

## 2020-10-01 DIAGNOSIS — R059 Cough, unspecified: Secondary | ICD-10-CM

## 2020-10-03 ENCOUNTER — Encounter (INDEPENDENT_AMBULATORY_CARE_PROVIDER_SITE_OTHER): Payer: Medicare Other | Admitting: Ophthalmology

## 2020-10-09 ENCOUNTER — Encounter (INDEPENDENT_AMBULATORY_CARE_PROVIDER_SITE_OTHER): Payer: Medicare Other | Admitting: Ophthalmology

## 2020-10-10 ENCOUNTER — Encounter (INDEPENDENT_AMBULATORY_CARE_PROVIDER_SITE_OTHER): Payer: Medicare Other | Admitting: Ophthalmology

## 2020-10-10 ENCOUNTER — Other Ambulatory Visit: Payer: Self-pay

## 2020-10-10 DIAGNOSIS — H35033 Hypertensive retinopathy, bilateral: Secondary | ICD-10-CM

## 2020-10-10 DIAGNOSIS — H43813 Vitreous degeneration, bilateral: Secondary | ICD-10-CM

## 2020-10-10 DIAGNOSIS — H35372 Puckering of macula, left eye: Secondary | ICD-10-CM

## 2020-10-10 DIAGNOSIS — I10 Essential (primary) hypertension: Secondary | ICD-10-CM

## 2020-10-10 DIAGNOSIS — H353132 Nonexudative age-related macular degeneration, bilateral, intermediate dry stage: Secondary | ICD-10-CM

## 2020-10-10 DIAGNOSIS — H34832 Tributary (branch) retinal vein occlusion, left eye, with macular edema: Secondary | ICD-10-CM | POA: Diagnosis not present

## 2020-11-17 ENCOUNTER — Other Ambulatory Visit: Payer: Self-pay

## 2020-11-17 ENCOUNTER — Encounter (INDEPENDENT_AMBULATORY_CARE_PROVIDER_SITE_OTHER): Payer: Medicare Other | Admitting: Ophthalmology

## 2020-11-17 DIAGNOSIS — H34832 Tributary (branch) retinal vein occlusion, left eye, with macular edema: Secondary | ICD-10-CM

## 2020-11-17 DIAGNOSIS — I10 Essential (primary) hypertension: Secondary | ICD-10-CM

## 2020-11-17 DIAGNOSIS — H353132 Nonexudative age-related macular degeneration, bilateral, intermediate dry stage: Secondary | ICD-10-CM | POA: Diagnosis not present

## 2020-11-17 DIAGNOSIS — H43813 Vitreous degeneration, bilateral: Secondary | ICD-10-CM

## 2020-11-17 DIAGNOSIS — H35033 Hypertensive retinopathy, bilateral: Secondary | ICD-10-CM | POA: Diagnosis not present

## 2020-11-17 DIAGNOSIS — H35372 Puckering of macula, left eye: Secondary | ICD-10-CM

## 2020-12-31 ENCOUNTER — Encounter (INDEPENDENT_AMBULATORY_CARE_PROVIDER_SITE_OTHER): Payer: Medicare Other | Admitting: Ophthalmology

## 2020-12-31 ENCOUNTER — Other Ambulatory Visit: Payer: Self-pay

## 2020-12-31 DIAGNOSIS — H353132 Nonexudative age-related macular degeneration, bilateral, intermediate dry stage: Secondary | ICD-10-CM

## 2020-12-31 DIAGNOSIS — I10 Essential (primary) hypertension: Secondary | ICD-10-CM

## 2020-12-31 DIAGNOSIS — H34832 Tributary (branch) retinal vein occlusion, left eye, with macular edema: Secondary | ICD-10-CM

## 2020-12-31 DIAGNOSIS — H35033 Hypertensive retinopathy, bilateral: Secondary | ICD-10-CM

## 2020-12-31 DIAGNOSIS — H43813 Vitreous degeneration, bilateral: Secondary | ICD-10-CM

## 2020-12-31 DIAGNOSIS — H35372 Puckering of macula, left eye: Secondary | ICD-10-CM

## 2021-02-11 ENCOUNTER — Other Ambulatory Visit: Payer: Self-pay

## 2021-02-11 ENCOUNTER — Encounter (INDEPENDENT_AMBULATORY_CARE_PROVIDER_SITE_OTHER): Payer: Medicare Other | Admitting: Ophthalmology

## 2021-02-11 DIAGNOSIS — H43813 Vitreous degeneration, bilateral: Secondary | ICD-10-CM

## 2021-02-11 DIAGNOSIS — H34832 Tributary (branch) retinal vein occlusion, left eye, with macular edema: Secondary | ICD-10-CM | POA: Diagnosis not present

## 2021-02-11 DIAGNOSIS — H353132 Nonexudative age-related macular degeneration, bilateral, intermediate dry stage: Secondary | ICD-10-CM | POA: Diagnosis not present

## 2021-02-11 DIAGNOSIS — H35033 Hypertensive retinopathy, bilateral: Secondary | ICD-10-CM

## 2021-02-11 DIAGNOSIS — I10 Essential (primary) hypertension: Secondary | ICD-10-CM

## 2021-02-11 DIAGNOSIS — H35372 Puckering of macula, left eye: Secondary | ICD-10-CM

## 2021-03-25 ENCOUNTER — Encounter (INDEPENDENT_AMBULATORY_CARE_PROVIDER_SITE_OTHER): Payer: Medicare Other | Admitting: Ophthalmology

## 2021-03-25 ENCOUNTER — Other Ambulatory Visit: Payer: Self-pay

## 2021-03-25 DIAGNOSIS — H35372 Puckering of macula, left eye: Secondary | ICD-10-CM

## 2021-03-25 DIAGNOSIS — H35033 Hypertensive retinopathy, bilateral: Secondary | ICD-10-CM

## 2021-03-25 DIAGNOSIS — H43813 Vitreous degeneration, bilateral: Secondary | ICD-10-CM

## 2021-03-25 DIAGNOSIS — H353132 Nonexudative age-related macular degeneration, bilateral, intermediate dry stage: Secondary | ICD-10-CM

## 2021-03-25 DIAGNOSIS — I10 Essential (primary) hypertension: Secondary | ICD-10-CM

## 2021-03-25 DIAGNOSIS — H34832 Tributary (branch) retinal vein occlusion, left eye, with macular edema: Secondary | ICD-10-CM

## 2021-05-01 ENCOUNTER — Other Ambulatory Visit: Payer: Self-pay

## 2021-05-01 ENCOUNTER — Encounter (INDEPENDENT_AMBULATORY_CARE_PROVIDER_SITE_OTHER): Payer: Medicare Other | Admitting: Ophthalmology

## 2021-05-01 DIAGNOSIS — I10 Essential (primary) hypertension: Secondary | ICD-10-CM | POA: Diagnosis not present

## 2021-05-01 DIAGNOSIS — H35033 Hypertensive retinopathy, bilateral: Secondary | ICD-10-CM

## 2021-05-01 DIAGNOSIS — H353132 Nonexudative age-related macular degeneration, bilateral, intermediate dry stage: Secondary | ICD-10-CM | POA: Diagnosis not present

## 2021-05-01 DIAGNOSIS — H43813 Vitreous degeneration, bilateral: Secondary | ICD-10-CM | POA: Diagnosis not present

## 2021-05-01 DIAGNOSIS — H34832 Tributary (branch) retinal vein occlusion, left eye, with macular edema: Secondary | ICD-10-CM | POA: Diagnosis not present

## 2021-05-01 DIAGNOSIS — H35372 Puckering of macula, left eye: Secondary | ICD-10-CM

## 2021-05-04 ENCOUNTER — Encounter (INDEPENDENT_AMBULATORY_CARE_PROVIDER_SITE_OTHER): Payer: Medicare Other | Admitting: Ophthalmology

## 2021-05-11 ENCOUNTER — Encounter (INDEPENDENT_AMBULATORY_CARE_PROVIDER_SITE_OTHER): Payer: Medicare Other | Admitting: Ophthalmology

## 2021-06-15 ENCOUNTER — Encounter (INDEPENDENT_AMBULATORY_CARE_PROVIDER_SITE_OTHER): Payer: Medicare Other | Admitting: Ophthalmology

## 2021-06-15 DIAGNOSIS — H35033 Hypertensive retinopathy, bilateral: Secondary | ICD-10-CM

## 2021-06-15 DIAGNOSIS — H353132 Nonexudative age-related macular degeneration, bilateral, intermediate dry stage: Secondary | ICD-10-CM

## 2021-06-15 DIAGNOSIS — H43813 Vitreous degeneration, bilateral: Secondary | ICD-10-CM | POA: Diagnosis not present

## 2021-06-15 DIAGNOSIS — I10 Essential (primary) hypertension: Secondary | ICD-10-CM

## 2021-06-15 DIAGNOSIS — H34832 Tributary (branch) retinal vein occlusion, left eye, with macular edema: Secondary | ICD-10-CM | POA: Diagnosis not present

## 2021-07-24 ENCOUNTER — Encounter (INDEPENDENT_AMBULATORY_CARE_PROVIDER_SITE_OTHER): Payer: Medicare Other | Admitting: Ophthalmology

## 2021-07-24 DIAGNOSIS — H34832 Tributary (branch) retinal vein occlusion, left eye, with macular edema: Secondary | ICD-10-CM

## 2021-07-24 DIAGNOSIS — H353132 Nonexudative age-related macular degeneration, bilateral, intermediate dry stage: Secondary | ICD-10-CM | POA: Diagnosis not present

## 2021-07-24 DIAGNOSIS — H35033 Hypertensive retinopathy, bilateral: Secondary | ICD-10-CM | POA: Diagnosis not present

## 2021-07-24 DIAGNOSIS — I10 Essential (primary) hypertension: Secondary | ICD-10-CM | POA: Diagnosis not present

## 2021-07-24 DIAGNOSIS — H43813 Vitreous degeneration, bilateral: Secondary | ICD-10-CM

## 2021-07-24 DIAGNOSIS — H35372 Puckering of macula, left eye: Secondary | ICD-10-CM

## 2021-08-12 IMAGING — CR DG CHEST 2V
2 series · 2 of 2 positions shown · non-contrast
Comparison: July 02, 2003

CLINICAL DATA: Cough, fever.

EXAM:
CHEST - 2 VIEW

[w chest pa *]
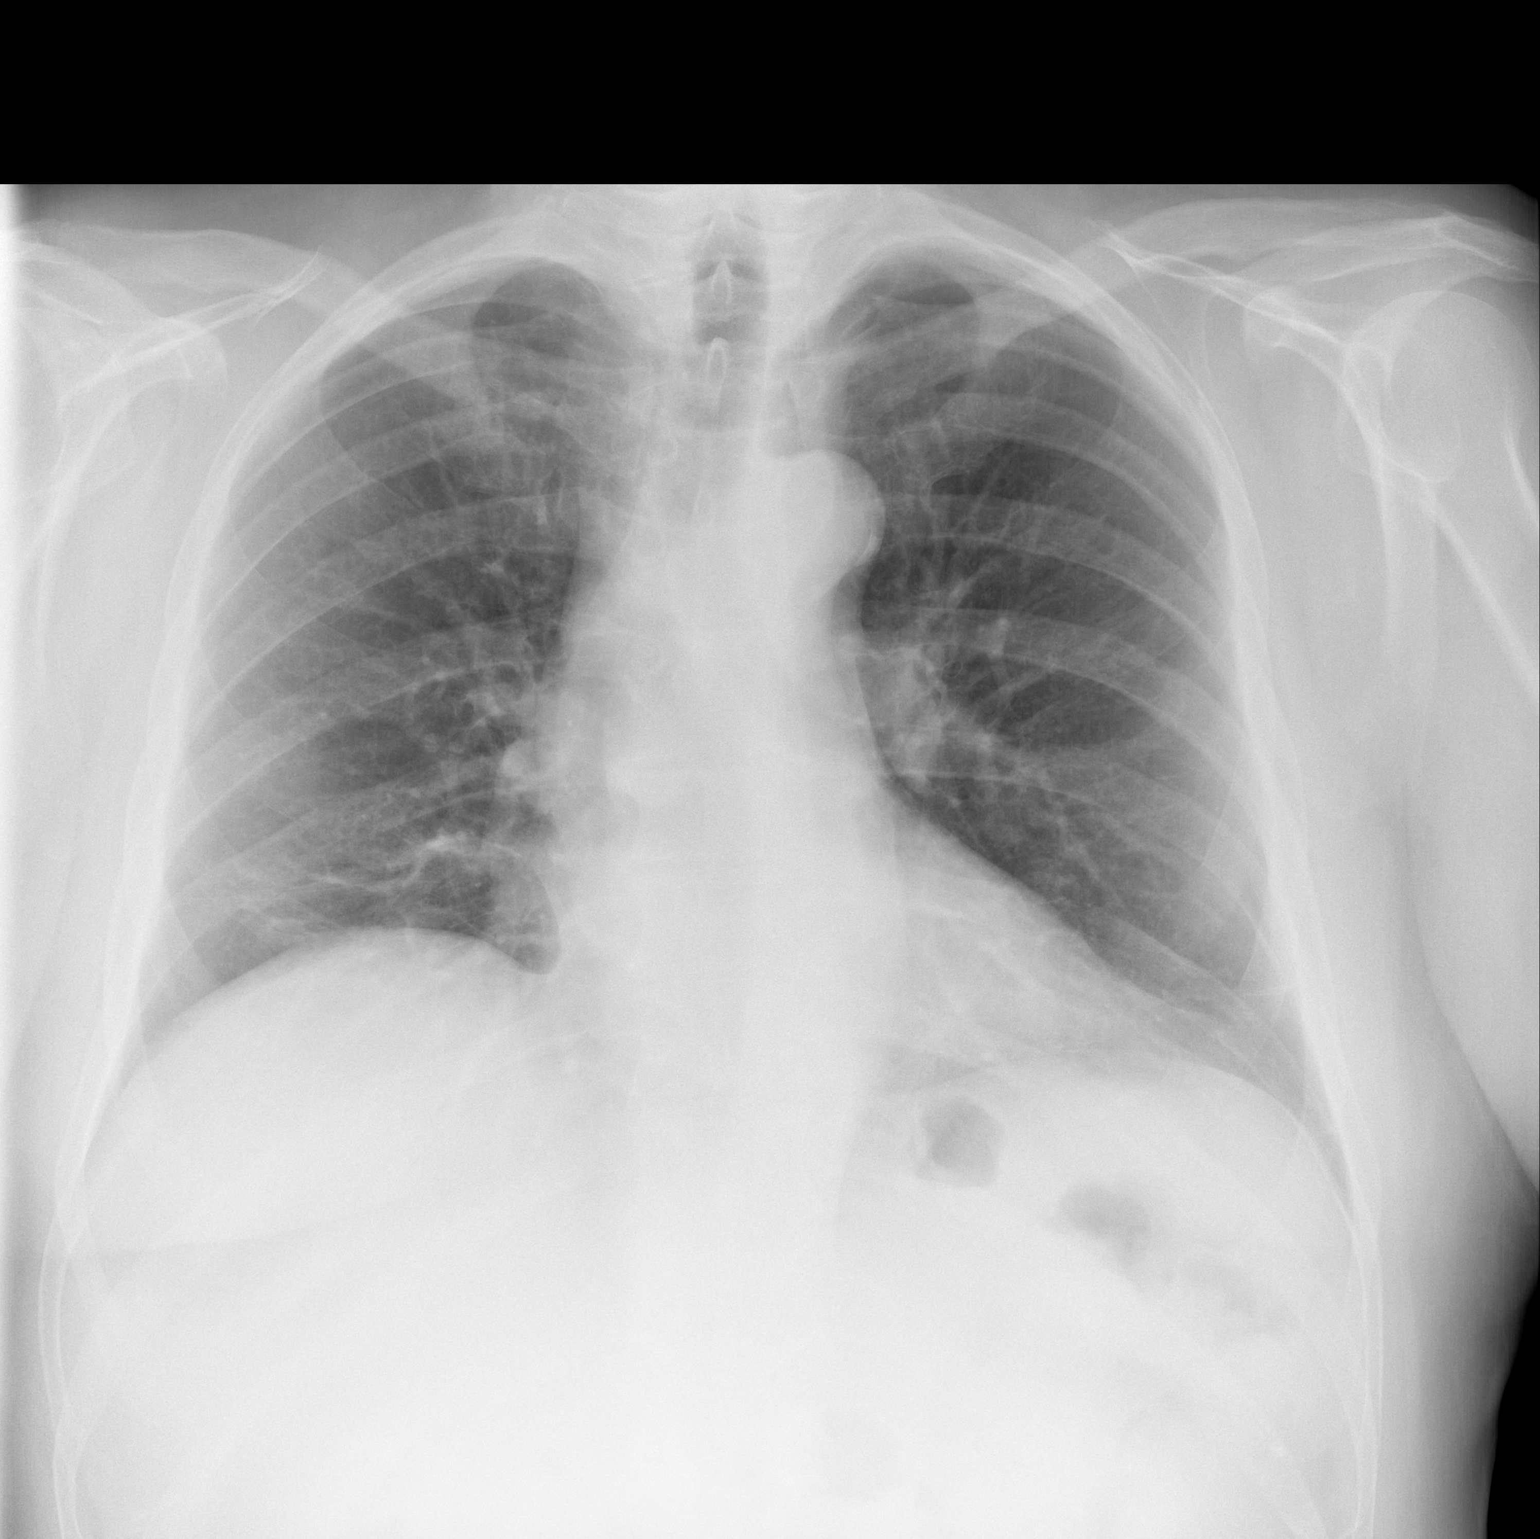

[w chest lat *]
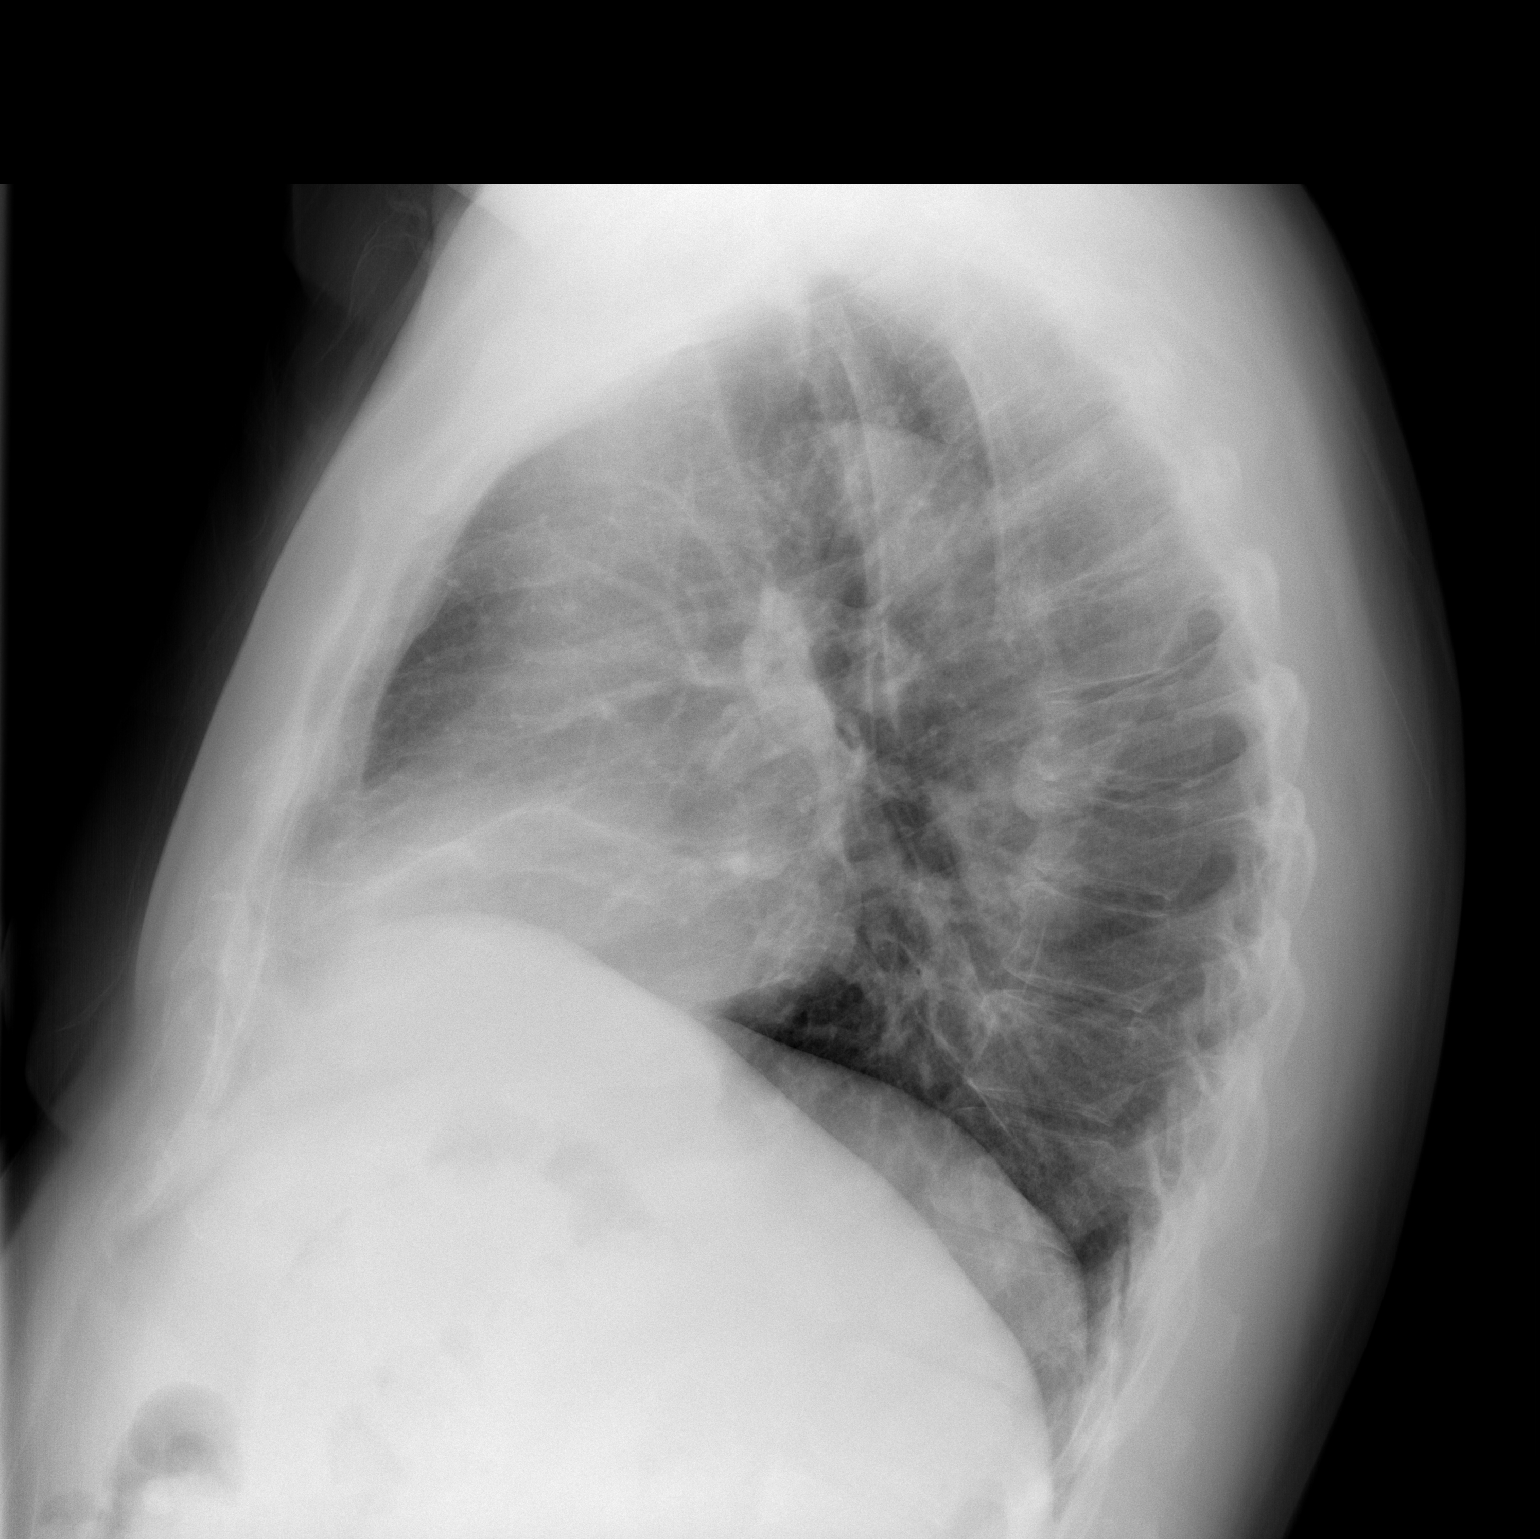

[2 of 2 positions shown; findings below may reference images not displayed]

FINDINGS: The heart size and mediastinal contours are within normal limits.
Calcific atherosclerosis of the aorta. Mild right basilar opacities.
No visible pleural effusions or pneumothorax. Degenerative changes
of the thoracic spine.
IMPRESSION: Mild right basilar opacities, which could represent atelectasis or
early pneumonia.

## 2021-08-28 ENCOUNTER — Encounter (INDEPENDENT_AMBULATORY_CARE_PROVIDER_SITE_OTHER): Payer: Medicare Other | Admitting: Ophthalmology

## 2021-08-28 DIAGNOSIS — I10 Essential (primary) hypertension: Secondary | ICD-10-CM | POA: Diagnosis not present

## 2021-08-28 DIAGNOSIS — H353132 Nonexudative age-related macular degeneration, bilateral, intermediate dry stage: Secondary | ICD-10-CM

## 2021-08-28 DIAGNOSIS — H43813 Vitreous degeneration, bilateral: Secondary | ICD-10-CM

## 2021-08-28 DIAGNOSIS — H35033 Hypertensive retinopathy, bilateral: Secondary | ICD-10-CM | POA: Diagnosis not present

## 2021-08-28 DIAGNOSIS — H34832 Tributary (branch) retinal vein occlusion, left eye, with macular edema: Secondary | ICD-10-CM

## 2021-08-28 DIAGNOSIS — H35372 Puckering of macula, left eye: Secondary | ICD-10-CM

## 2021-10-02 ENCOUNTER — Encounter (INDEPENDENT_AMBULATORY_CARE_PROVIDER_SITE_OTHER): Payer: Medicare Other | Admitting: Ophthalmology

## 2021-10-02 DIAGNOSIS — H35033 Hypertensive retinopathy, bilateral: Secondary | ICD-10-CM | POA: Diagnosis not present

## 2021-10-02 DIAGNOSIS — H353132 Nonexudative age-related macular degeneration, bilateral, intermediate dry stage: Secondary | ICD-10-CM

## 2021-10-02 DIAGNOSIS — H43813 Vitreous degeneration, bilateral: Secondary | ICD-10-CM

## 2021-10-02 DIAGNOSIS — H35372 Puckering of macula, left eye: Secondary | ICD-10-CM

## 2021-10-02 DIAGNOSIS — I10 Essential (primary) hypertension: Secondary | ICD-10-CM

## 2021-10-02 DIAGNOSIS — H34832 Tributary (branch) retinal vein occlusion, left eye, with macular edema: Secondary | ICD-10-CM

## 2021-11-06 ENCOUNTER — Encounter (INDEPENDENT_AMBULATORY_CARE_PROVIDER_SITE_OTHER): Payer: Medicare Other | Admitting: Ophthalmology

## 2021-11-06 DIAGNOSIS — I10 Essential (primary) hypertension: Secondary | ICD-10-CM

## 2021-11-06 DIAGNOSIS — H35033 Hypertensive retinopathy, bilateral: Secondary | ICD-10-CM | POA: Diagnosis not present

## 2021-11-06 DIAGNOSIS — H34832 Tributary (branch) retinal vein occlusion, left eye, with macular edema: Secondary | ICD-10-CM | POA: Diagnosis not present

## 2021-11-06 DIAGNOSIS — H353132 Nonexudative age-related macular degeneration, bilateral, intermediate dry stage: Secondary | ICD-10-CM | POA: Diagnosis not present

## 2021-11-06 DIAGNOSIS — H43813 Vitreous degeneration, bilateral: Secondary | ICD-10-CM

## 2021-11-06 DIAGNOSIS — H35372 Puckering of macula, left eye: Secondary | ICD-10-CM

## 2021-12-11 ENCOUNTER — Other Ambulatory Visit: Payer: Self-pay | Admitting: Orthopedic Surgery

## 2021-12-11 ENCOUNTER — Encounter (INDEPENDENT_AMBULATORY_CARE_PROVIDER_SITE_OTHER): Payer: Medicare Other | Admitting: Ophthalmology

## 2021-12-11 DIAGNOSIS — M25571 Pain in right ankle and joints of right foot: Secondary | ICD-10-CM

## 2021-12-14 ENCOUNTER — Encounter (INDEPENDENT_AMBULATORY_CARE_PROVIDER_SITE_OTHER): Payer: Medicare Other | Admitting: Ophthalmology

## 2021-12-14 DIAGNOSIS — H34832 Tributary (branch) retinal vein occlusion, left eye, with macular edema: Secondary | ICD-10-CM

## 2021-12-14 DIAGNOSIS — H35033 Hypertensive retinopathy, bilateral: Secondary | ICD-10-CM

## 2021-12-14 DIAGNOSIS — H353132 Nonexudative age-related macular degeneration, bilateral, intermediate dry stage: Secondary | ICD-10-CM | POA: Diagnosis not present

## 2021-12-14 DIAGNOSIS — I10 Essential (primary) hypertension: Secondary | ICD-10-CM

## 2021-12-14 DIAGNOSIS — H35372 Puckering of macula, left eye: Secondary | ICD-10-CM

## 2021-12-14 DIAGNOSIS — H43813 Vitreous degeneration, bilateral: Secondary | ICD-10-CM

## 2021-12-23 ENCOUNTER — Ambulatory Visit
Admission: RE | Admit: 2021-12-23 | Discharge: 2021-12-23 | Disposition: A | Discharge status: Home / Self Care | Payer: Medicare Other | Source: Ambulatory Visit | Attending: Orthopedic Surgery | Admitting: Orthopedic Surgery

## 2021-12-23 DIAGNOSIS — M25571 Pain in right ankle and joints of right foot: Secondary | ICD-10-CM

## 2021-12-23 HISTORY — PX: KNEE ARTHROSCOPY: SUR90

## 2022-01-19 ENCOUNTER — Encounter (INDEPENDENT_AMBULATORY_CARE_PROVIDER_SITE_OTHER): Payer: Medicare Other | Admitting: Ophthalmology

## 2022-01-22 ENCOUNTER — Encounter (INDEPENDENT_AMBULATORY_CARE_PROVIDER_SITE_OTHER): Payer: Medicare Other | Admitting: Ophthalmology

## 2022-01-22 DIAGNOSIS — H35033 Hypertensive retinopathy, bilateral: Secondary | ICD-10-CM

## 2022-01-22 DIAGNOSIS — H353132 Nonexudative age-related macular degeneration, bilateral, intermediate dry stage: Secondary | ICD-10-CM | POA: Diagnosis not present

## 2022-01-22 DIAGNOSIS — H34832 Tributary (branch) retinal vein occlusion, left eye, with macular edema: Secondary | ICD-10-CM

## 2022-01-22 DIAGNOSIS — H35372 Puckering of macula, left eye: Secondary | ICD-10-CM

## 2022-01-22 DIAGNOSIS — I10 Essential (primary) hypertension: Secondary | ICD-10-CM

## 2022-01-22 DIAGNOSIS — H43813 Vitreous degeneration, bilateral: Secondary | ICD-10-CM

## 2022-03-02 ENCOUNTER — Encounter (INDEPENDENT_AMBULATORY_CARE_PROVIDER_SITE_OTHER): Payer: Medicare Other | Admitting: Ophthalmology

## 2022-03-02 DIAGNOSIS — H35033 Hypertensive retinopathy, bilateral: Secondary | ICD-10-CM | POA: Diagnosis not present

## 2022-03-02 DIAGNOSIS — H353132 Nonexudative age-related macular degeneration, bilateral, intermediate dry stage: Secondary | ICD-10-CM | POA: Diagnosis not present

## 2022-03-02 DIAGNOSIS — H34832 Tributary (branch) retinal vein occlusion, left eye, with macular edema: Secondary | ICD-10-CM | POA: Diagnosis not present

## 2022-03-02 DIAGNOSIS — I10 Essential (primary) hypertension: Secondary | ICD-10-CM | POA: Diagnosis not present

## 2022-03-02 DIAGNOSIS — H35372 Puckering of macula, left eye: Secondary | ICD-10-CM

## 2022-03-02 DIAGNOSIS — H43813 Vitreous degeneration, bilateral: Secondary | ICD-10-CM

## 2022-03-02 DIAGNOSIS — D3132 Benign neoplasm of left choroid: Secondary | ICD-10-CM

## 2022-04-05 ENCOUNTER — Encounter (INDEPENDENT_AMBULATORY_CARE_PROVIDER_SITE_OTHER): Payer: Medicare Other | Admitting: Ophthalmology

## 2022-04-05 DIAGNOSIS — H34832 Tributary (branch) retinal vein occlusion, left eye, with macular edema: Secondary | ICD-10-CM | POA: Diagnosis not present

## 2022-04-05 DIAGNOSIS — H35372 Puckering of macula, left eye: Secondary | ICD-10-CM

## 2022-04-05 DIAGNOSIS — H43813 Vitreous degeneration, bilateral: Secondary | ICD-10-CM

## 2022-04-05 DIAGNOSIS — H353132 Nonexudative age-related macular degeneration, bilateral, intermediate dry stage: Secondary | ICD-10-CM

## 2022-04-05 DIAGNOSIS — D3132 Benign neoplasm of left choroid: Secondary | ICD-10-CM

## 2022-04-05 DIAGNOSIS — I10 Essential (primary) hypertension: Secondary | ICD-10-CM | POA: Diagnosis not present

## 2022-04-05 DIAGNOSIS — H35033 Hypertensive retinopathy, bilateral: Secondary | ICD-10-CM

## 2022-05-12 ENCOUNTER — Encounter (INDEPENDENT_AMBULATORY_CARE_PROVIDER_SITE_OTHER): Payer: Medicare Other | Admitting: Ophthalmology

## 2022-05-12 DIAGNOSIS — H34832 Tributary (branch) retinal vein occlusion, left eye, with macular edema: Secondary | ICD-10-CM

## 2022-05-12 DIAGNOSIS — I10 Essential (primary) hypertension: Secondary | ICD-10-CM

## 2022-05-12 DIAGNOSIS — D3132 Benign neoplasm of left choroid: Secondary | ICD-10-CM

## 2022-05-12 DIAGNOSIS — H353132 Nonexudative age-related macular degeneration, bilateral, intermediate dry stage: Secondary | ICD-10-CM | POA: Diagnosis not present

## 2022-05-12 DIAGNOSIS — H35033 Hypertensive retinopathy, bilateral: Secondary | ICD-10-CM | POA: Diagnosis not present

## 2022-05-12 DIAGNOSIS — H43813 Vitreous degeneration, bilateral: Secondary | ICD-10-CM

## 2022-06-11 ENCOUNTER — Encounter (INDEPENDENT_AMBULATORY_CARE_PROVIDER_SITE_OTHER): Payer: Medicare Other | Admitting: Ophthalmology

## 2022-06-11 DIAGNOSIS — H35033 Hypertensive retinopathy, bilateral: Secondary | ICD-10-CM

## 2022-06-11 DIAGNOSIS — I10 Essential (primary) hypertension: Secondary | ICD-10-CM | POA: Diagnosis not present

## 2022-06-11 DIAGNOSIS — H353132 Nonexudative age-related macular degeneration, bilateral, intermediate dry stage: Secondary | ICD-10-CM | POA: Diagnosis not present

## 2022-06-11 DIAGNOSIS — H43813 Vitreous degeneration, bilateral: Secondary | ICD-10-CM

## 2022-06-11 DIAGNOSIS — H34832 Tributary (branch) retinal vein occlusion, left eye, with macular edema: Secondary | ICD-10-CM | POA: Diagnosis not present

## 2022-06-11 DIAGNOSIS — H35372 Puckering of macula, left eye: Secondary | ICD-10-CM

## 2022-06-11 DIAGNOSIS — D3132 Benign neoplasm of left choroid: Secondary | ICD-10-CM

## 2022-07-14 ENCOUNTER — Encounter (INDEPENDENT_AMBULATORY_CARE_PROVIDER_SITE_OTHER): Payer: Medicare Other | Admitting: Ophthalmology

## 2022-07-14 DIAGNOSIS — H34832 Tributary (branch) retinal vein occlusion, left eye, with macular edema: Secondary | ICD-10-CM

## 2022-07-14 DIAGNOSIS — I10 Essential (primary) hypertension: Secondary | ICD-10-CM

## 2022-07-14 DIAGNOSIS — D3132 Benign neoplasm of left choroid: Secondary | ICD-10-CM

## 2022-07-14 DIAGNOSIS — H43813 Vitreous degeneration, bilateral: Secondary | ICD-10-CM

## 2022-07-14 DIAGNOSIS — H35033 Hypertensive retinopathy, bilateral: Secondary | ICD-10-CM

## 2022-07-14 DIAGNOSIS — H353132 Nonexudative age-related macular degeneration, bilateral, intermediate dry stage: Secondary | ICD-10-CM

## 2022-08-20 ENCOUNTER — Encounter (INDEPENDENT_AMBULATORY_CARE_PROVIDER_SITE_OTHER): Payer: Medicare Other | Admitting: Ophthalmology

## 2022-08-20 DIAGNOSIS — I10 Essential (primary) hypertension: Secondary | ICD-10-CM

## 2022-08-20 DIAGNOSIS — H34832 Tributary (branch) retinal vein occlusion, left eye, with macular edema: Secondary | ICD-10-CM

## 2022-08-20 DIAGNOSIS — H35372 Puckering of macula, left eye: Secondary | ICD-10-CM

## 2022-08-20 DIAGNOSIS — H43813 Vitreous degeneration, bilateral: Secondary | ICD-10-CM

## 2022-08-20 DIAGNOSIS — H353132 Nonexudative age-related macular degeneration, bilateral, intermediate dry stage: Secondary | ICD-10-CM | POA: Diagnosis not present

## 2022-08-20 DIAGNOSIS — D3132 Benign neoplasm of left choroid: Secondary | ICD-10-CM

## 2022-08-20 DIAGNOSIS — H35033 Hypertensive retinopathy, bilateral: Secondary | ICD-10-CM

## 2022-09-09 ENCOUNTER — Other Ambulatory Visit: Payer: Self-pay | Admitting: Neurosurgery

## 2022-09-17 ENCOUNTER — Encounter (INDEPENDENT_AMBULATORY_CARE_PROVIDER_SITE_OTHER): Payer: Medicare Other | Admitting: Ophthalmology

## 2022-09-17 DIAGNOSIS — H35372 Puckering of macula, left eye: Secondary | ICD-10-CM

## 2022-09-17 DIAGNOSIS — I10 Essential (primary) hypertension: Secondary | ICD-10-CM | POA: Diagnosis not present

## 2022-09-17 DIAGNOSIS — D3132 Benign neoplasm of left choroid: Secondary | ICD-10-CM

## 2022-09-17 DIAGNOSIS — H35033 Hypertensive retinopathy, bilateral: Secondary | ICD-10-CM

## 2022-09-17 DIAGNOSIS — H34832 Tributary (branch) retinal vein occlusion, left eye, with macular edema: Secondary | ICD-10-CM | POA: Diagnosis not present

## 2022-09-17 DIAGNOSIS — H353132 Nonexudative age-related macular degeneration, bilateral, intermediate dry stage: Secondary | ICD-10-CM

## 2022-09-17 DIAGNOSIS — H43813 Vitreous degeneration, bilateral: Secondary | ICD-10-CM

## 2022-09-22 NOTE — Pre-Procedure Instructions (Signed)
Surgical Instructions    Your procedure is scheduled on Monday, October 04, 2022 at 12:00 PM.  Report to [facility] Main Entrance "A" at 10:00 A.M., then check in with the Admitting office.  Call this number if you have problems the morning of surgery:  [phone]   If you have any questions prior to your surgery date call [phone]: Open Monday-Friday 8am-4pm  *If you experience any cold or flu symptoms such as cough, fever, chills, shortness of breath, etc. between now and your scheduled surgery, please notify us.*    Remember:  Do not eat after midnight the night before your surgery  You may drink clear liquids until 9:00 AM the morning of your surgery.   Clear liquids allowed are: Water, Non-Citrus Juices (without pulp), Carbonated Beverages, Clear Tea, Black Coffee Only (NO MILK, CREAM OR POWDERED CREAMER of any kind), and Gatorade.    Take these medicines the morning of surgery with A SIP OF WATER:   amLODipine (NORVASC)    IF NEEDED: acetaminophen (TYLENOL)  ALPRAZolam (XANAX)  carboxymethylcellulose (REFRESH PLUS) 0.5 % SOLN    Do NOT take metFORMIN (GLUCOPHAGE-XR) day of surgery.   As of today, STOP taking any Aspirin (unless otherwise instructed by your surgeon) Aleve, Naproxen, Ibuprofen, Motrin, Advil, Goody's, BC's, all herbal medications, fish oil, and all vitamins.                     Do NOT Smoke (Tobacco/Vaping) for 24 hours prior to your procedure.  If you use a CPAP at night, you may bring your mask/headgear for your overnight stay.   Contacts, glasses, piercing's, hearing aid's, dentures or partials may not be worn into surgery, please bring cases for these belongings.    For patients admitted to the hospital, discharge time will be determined by your treatment team.   Patients discharged the day of surgery will not be allowed to drive home, and someone needs to stay with them for 24 hours.  SURGICAL WAITING ROOM VISITATION Patients having  surgery or a procedure may have 2 support people in the waiting area. Visitors may stay in the waiting area during the procedure and switch out with other visitors if needed. Only 1 support person is allowed in the pre-op area with the patient AFTER the patient is prepped. This person cannot be switched out.  Children under the age of 24 must have an adult accompany them who is not the patient. If the patient needs to stay at the hospital during part of their recovery, the visitor guidelines for inpatient rooms apply.  Please refer to the [facility] website for the visitor guidelines for Inpatients (after your surgery is over and you are in a regular room).    Oral Hygiene is also important to reduce your risk of infection.  Remember - BRUSH YOUR TEETH THE MORNING OF SURGERY WITH YOUR REGULAR TOOTHPASTE     Pre-operative 5 CHG Bath Instructions   You can play a key role in reducing the risk of infection after surgery. Your skin needs to be as free of germs as possible. You can reduce the number of germs on your skin by washing with CHG (chlorhexidine gluconate) soap before surgery. CHG is an antiseptic soap that kills germs and continues to kill germs even after washing.   DO NOT use if you have an allergy to chlorhexidine/CHG or antibacterial soaps. If your skin becomes reddened or irritated, stop using the CHG and notify one of our RNs  at [phone].   Please shower with the CHG soap starting 4 days before surgery using the following schedule:     Please keep in mind the following:  DO NOT shave, including legs and underarms, starting the day of your first shower.   You may shave your face at any point before/day of surgery.  Place clean sheets on your bed the day you start using CHG soap. Use a clean washcloth (not used since being washed) for each shower. DO NOT sleep with pets once you start using the CHG.   CHG Shower Instructions:  If you choose to wash your hair and private  area, wash first with your normal shampoo/soap.  After you use shampoo/soap, rinse your hair and body thoroughly to remove shampoo/soap residue.  Turn the water OFF and apply about 3 tablespoons (45 ml) of CHG soap to a CLEAN washcloth.  Apply CHG soap ONLY FROM YOUR NECK DOWN TO YOUR TOES (washing for 3-5 minutes)  DO NOT use CHG soap on face, private areas, open wounds, or sores.  Pay special attention to the area where your surgery is being performed.  If you are having back surgery, having someone wash your back for you may be helpful. Wait 2 minutes after CHG soap is applied, then you may rinse off the CHG soap.  Pat dry with a clean towel  Put on clean clothes/pajamas   If you choose to wear lotion, please use ONLY the CHG-compatible lotions on the back of this paper.     Additional instructions for the day of surgery: DO NOT APPLY any lotions, deodorants, cologne, or perfumes.  Do not wear jewelry or makeup  Do not wear nail polish, gel polish, artificial nails, or any other type of covering on natural nails (fingers and toes). If you have artificial nails or gel coating that need to be removed by a nail salon, please have this removed prior to surgery. Artificial nails or gel coating may interfere with anesthesia's ability to adequately monitor your vital signs. Do not bring valuables to the hospital.  [facility] is not responsible for any belongings or valuables. Put on clean/comfortable clothes.  Brush your teeth.  Ask your nurse before applying any prescription medications to the skin.     CHG Compatible Lotions   Aveeno Moisturizing lotion  Cetaphil Moisturizing Cream  Cetaphil Moisturizing Lotion  Clairol Herbal Essence Moisturizing Lotion, Dry Skin  Clairol Herbal Essence Moisturizing Lotion, Extra Dry Skin  Clairol Herbal Essence Moisturizing Lotion, Normal Skin  Curel Age Defying Therapeutic Moisturizing Lotion with Alpha Hydroxy  Curel Extreme Care Body Lotion   Curel Soothing Hands Moisturizing Hand Lotion  Curel Therapeutic Moisturizing Cream, Fragrance-Free  Curel Therapeutic Moisturizing Lotion, Fragrance-Free  Curel Therapeutic Moisturizing Lotion, Original Formula  Eucerin Daily Replenishing Lotion  Eucerin Dry Skin Therapy Plus Alpha Hydroxy Crme  Eucerin Dry Skin Therapy Plus Alpha Hydroxy Lotion  Eucerin Original Crme  Eucerin Original Lotion  Eucerin Plus Crme Eucerin Plus Lotion  Eucerin TriLipid Replenishing Lotion  Keri Anti-Bacterial Hand Lotion  Keri Deep Conditioning Original Lotion Dry Skin Formula Softly Scented  Keri Deep Conditioning Original Lotion, Fragrance Free Sensitive Skin Formula  Keri Lotion Fast Absorbing Fragrance Free Sensitive Skin Formula  Keri Lotion Fast Absorbing Softly Scented Dry Skin Formula  Keri Original Lotion  Keri Skin Renewal Lotion Keri Silky Smooth Lotion  Keri Silky Smooth Sensitive Skin Lotion  Nivea Body Creamy Conditioning Oil  Nivea Body Extra Enriched Education administrator Original  Lotion  Nivea Body Sheer Moisturizing Lotion Nivea Crme  Nivea Skin Firming Lotion  NutraDerm 30 Skin Lotion  NutraDerm Skin Lotion  NutraDerm Therapeutic Skin Cream  NutraDerm Therapeutic Skin Lotion  ProShield Protective Hand Cream  Provon moisturizing lotion   Please read over the following fact sheets that you were given.  If you received a COVID test during your pre-op visit  it is requested that you wear a mask when out in public, stay away from anyone that may not be feeling well and notify your surgeon if you develop symptoms. If you have been in contact with anyone that has tested positive in the last 10 days please notify you surgeon.

## 2022-09-23 ENCOUNTER — Encounter (HOSPITAL_COMMUNITY): Payer: Self-pay

## 2022-09-23 ENCOUNTER — Other Ambulatory Visit: Payer: Self-pay

## 2022-09-23 ENCOUNTER — Encounter (HOSPITAL_COMMUNITY)
Admission: RE | Admit: 2022-09-23 | Discharge: 2022-09-23 | Disposition: A | Discharge status: Home / Self Care | Payer: Medicare Other | Source: Ambulatory Visit | Attending: Neurosurgery | Admitting: Neurosurgery

## 2022-09-23 VITALS — BP 171/86 | HR 72 | Temp 98.9°F | Resp 18 | Ht 73.0 in | Wt 221.9 lb

## 2022-09-23 DIAGNOSIS — Z01812 Encounter for preprocedural laboratory examination: Secondary | ICD-10-CM | POA: Diagnosis not present

## 2022-09-23 DIAGNOSIS — Z01810 Encounter for preprocedural cardiovascular examination: Secondary | ICD-10-CM | POA: Diagnosis not present

## 2022-09-23 DIAGNOSIS — K759 Inflammatory liver disease, unspecified: Secondary | ICD-10-CM | POA: Insufficient documentation

## 2022-09-23 DIAGNOSIS — E119 Type 2 diabetes mellitus without complications: Secondary | ICD-10-CM | POA: Diagnosis not present

## 2022-09-23 DIAGNOSIS — Z01818 Encounter for other preprocedural examination: Secondary | ICD-10-CM | POA: Diagnosis present

## 2022-09-23 HISTORY — DX: Fatty (change of) liver, not elsewhere classified: K76.0

## 2022-09-23 HISTORY — DX: Type 2 diabetes mellitus without complications: E11.9

## 2022-09-23 HISTORY — DX: Unspecified macular degeneration: H35.30

## 2022-09-23 LAB — CBC
HCT: 49 % (ref 39.0–52.0)
Hemoglobin: 16.2 g/dL (ref 13.0–17.0)
MCH: 29.2 pg (ref 26.0–34.0)
MCHC: 33.1 g/dL (ref 30.0–36.0)
MCV: 88.3 fL (ref 80.0–100.0)
Platelets: 279 10*3/uL (ref 150–400)
RBC: 5.55 MIL/uL (ref 4.22–5.81)
RDW: 13.8 % (ref 11.5–15.5)
WBC: 10.7 10*3/uL — ABNORMAL HIGH (ref 4.0–10.5)
nRBC: 0 % (ref 0.0–0.2)

## 2022-09-23 LAB — TYPE AND SCREEN
ABO/RH(D): O POS
Antibody Screen: NEGATIVE

## 2022-09-23 LAB — COMPREHENSIVE METABOLIC PANEL
ALT: 30 U/L (ref 0–44)
AST: 30 U/L (ref 15–41)
Albumin: 4.1 g/dL (ref 3.5–5.0)
Alkaline Phosphatase: 68 U/L (ref 38–126)
Anion gap: 10 (ref 5–15)
BUN: 17 mg/dL (ref 8–23)
CO2: 28 mmol/L (ref 22–32)
Calcium: 9.5 mg/dL (ref 8.9–10.3)
Chloride: 103 mmol/L (ref 98–111)
Creatinine, Ser: 0.92 mg/dL (ref 0.61–1.24)
GFR, Estimated: >60 mL/min (ref >60)
Glucose, Bld: 142 mg/dL — ABNORMAL HIGH (ref 70–99)
Potassium: 3.6 mmol/L (ref 3.5–5.1)
Sodium: 141 mmol/L (ref 135–145)
Total Bilirubin: 0.9 mg/dL (ref 0.3–1.2)
Total Protein: 6.9 g/dL (ref 6.5–8.1)

## 2022-09-23 LAB — SURGICAL PCR SCREEN
MRSA, PCR: NEGATIVE
Staphylococcus aureus: NEGATIVE

## 2022-09-23 LAB — GLUCOSE, CAPILLARY: Glucose-Capillary: 198 mg/dL — ABNORMAL HIGH (ref 70–99)

## 2022-09-23 NOTE — Progress Notes (Signed)
PCP - Dr. Blair Heys Cardiologist - denies Retina-Dr. Ashley Royalty  PPM/ICD - n/a  Chest x-ray - n/a EKG - 09/23/22 Stress Test - 2014 ECHO - denies Cardiac Cath - denies  Sleep Study - denies CPAP - denies  Fasting Blood Sugar - 130-150 Checks Blood Sugar every other day Last A1C: 08/18/22, 6.6. Scanned report in shadow chart.  Last dose of GLP1 agonist-  n/a GLP1 instructions: n/a  Blood Thinner Instructions: n/a Aspirin Instructions: n/a  ERAS Protcol -Clear liquids until 0900 DOS PRE-SURGERY Ensure or G2- none ordered.  COVID TEST- n/a   Anesthesia review: Yes, requested records from PCP, [facility] [place].   Patient denies shortness of breath, fever, cough and chest pain at PAT appointment   All instructions explained to the patient, with a verbal understanding of the material. Patient agrees to go over the instructions while at home for a better understanding. Patient also instructed to self quarantine after being tested for COVID-19. The opportunity to ask questions was provided.

## 2022-10-04 ENCOUNTER — Encounter (HOSPITAL_COMMUNITY)
Admission: AD | Disposition: A | Discharge status: Home / Self Care | Payer: Self-pay | Source: Ambulatory Visit | Attending: Neurosurgery

## 2022-10-04 ENCOUNTER — Ambulatory Visit (HOSPITAL_BASED_OUTPATIENT_CLINIC_OR_DEPARTMENT_OTHER): Payer: Medicare Other

## 2022-10-04 ENCOUNTER — Other Ambulatory Visit: Payer: Self-pay

## 2022-10-04 ENCOUNTER — Ambulatory Visit (HOSPITAL_COMMUNITY): Payer: Medicare Other

## 2022-10-04 ENCOUNTER — Ambulatory Visit (HOSPITAL_COMMUNITY): Payer: Medicare Other | Admitting: Physician Assistant

## 2022-10-04 ENCOUNTER — Encounter (HOSPITAL_COMMUNITY): Payer: Self-pay | Admitting: Neurosurgery

## 2022-10-04 ENCOUNTER — Inpatient Hospital Stay (HOSPITAL_COMMUNITY)
Admission: AD | Admit: 2022-10-04 | Discharge: 2022-10-06 | DRG: 460 | Disposition: A | Discharge status: Home / Self Care | Payer: Medicare Other | Source: Ambulatory Visit | Attending: Neurosurgery | Admitting: Neurosurgery

## 2022-10-04 DIAGNOSIS — Z7984 Long term (current) use of oral hypoglycemic drugs: Secondary | ICD-10-CM

## 2022-10-04 DIAGNOSIS — M4316 Spondylolisthesis, lumbar region: Secondary | ICD-10-CM | POA: Diagnosis present

## 2022-10-04 DIAGNOSIS — H3530 Unspecified macular degeneration: Secondary | ICD-10-CM | POA: Diagnosis present

## 2022-10-04 DIAGNOSIS — Z823 Family history of stroke: Secondary | ICD-10-CM

## 2022-10-04 DIAGNOSIS — Z833 Family history of diabetes mellitus: Secondary | ICD-10-CM

## 2022-10-04 DIAGNOSIS — M48061 Spinal stenosis, lumbar region without neurogenic claudication: Principal | ICD-10-CM | POA: Diagnosis present

## 2022-10-04 DIAGNOSIS — Z85828 Personal history of other malignant neoplasm of skin: Secondary | ICD-10-CM

## 2022-10-04 DIAGNOSIS — Z888 Allergy status to other drugs, medicaments and biological substances status: Secondary | ICD-10-CM

## 2022-10-04 DIAGNOSIS — Z79899 Other long term (current) drug therapy: Secondary | ICD-10-CM

## 2022-10-04 DIAGNOSIS — Z886 Allergy status to analgesic agent status: Secondary | ICD-10-CM

## 2022-10-04 DIAGNOSIS — E119 Type 2 diabetes mellitus without complications: Secondary | ICD-10-CM | POA: Diagnosis present

## 2022-10-04 DIAGNOSIS — I10 Essential (primary) hypertension: Secondary | ICD-10-CM | POA: Diagnosis present

## 2022-10-04 DIAGNOSIS — K760 Fatty (change of) liver, not elsewhere classified: Secondary | ICD-10-CM | POA: Diagnosis present

## 2022-10-04 LAB — GLUCOSE, CAPILLARY
Glucose-Capillary: 131 mg/dL — ABNORMAL HIGH (ref 70–99)
Glucose-Capillary: 145 mg/dL — ABNORMAL HIGH (ref 70–99)
Glucose-Capillary: 155 mg/dL — ABNORMAL HIGH (ref 70–99)
Glucose-Capillary: 165 mg/dL — ABNORMAL HIGH (ref 70–99)
Glucose-Capillary: 223 mg/dL — ABNORMAL HIGH (ref 70–99)

## 2022-10-04 SURGERY — POSTERIOR LUMBAR FUSION 1 LEVEL
Anesthesia: General | Site: Back

## 2022-10-04 MED ORDER — PRESERVISION AREDS 2 PO CAPS: ORAL_CAPSULE | Freq: Every morning | ORAL | Status: DC

## 2022-10-04 MED ORDER — HYDROMORPHONE HCL 1 MG/ML IJ SOLN: 0.2500 mg | INTRAMUSCULAR | Status: DC | PRN

## 2022-10-04 MED ORDER — PROMETHAZINE HCL 25 MG/ML IJ SOLN: 6.2500 mg | INTRAMUSCULAR | Status: DC | PRN

## 2022-10-04 MED ORDER — HYDROCODONE-ACETAMINOPHEN 5-325 MG PO TABS
2.0000 | ORAL_TABLET | ORAL | Status: DC | PRN
  Administered 2022-10-04 – 2022-10-06 (×7): 2 via ORAL
  Filled 2022-10-04 (×7): qty 2

## 2022-10-04 MED ORDER — MIDAZOLAM HCL 2 MG/2ML IJ SOLN
INTRAMUSCULAR | Status: AC
  Filled 2022-10-04: qty 2

## 2022-10-04 MED ORDER — INSULIN ASPART 100 UNIT/ML IJ SOLN
0.0000 [IU] | INTRAMUSCULAR | Status: DC | PRN
  Administered 2022-10-04: 2 [IU] via SUBCUTANEOUS

## 2022-10-04 MED ORDER — PROPOFOL 10 MG/ML IV BOLUS
INTRAVENOUS | Status: DC | PRN
  Administered 2022-10-04: 200 mg via INTRAVENOUS

## 2022-10-04 MED ORDER — EPHEDRINE 5 MG/ML INJ
INTRAVENOUS | Status: AC
  Filled 2022-10-04: qty 5

## 2022-10-04 MED ORDER — LIDOCAINE-EPINEPHRINE 1 %-1:100000 IJ SOLN
INTRAMUSCULAR | Status: AC
  Filled 2022-10-04: qty 1

## 2022-10-04 MED ORDER — AMISULPRIDE (ANTIEMETIC) 5 MG/2ML IV SOLN
INTRAVENOUS | Status: AC
  Filled 2022-10-04: qty 4

## 2022-10-04 MED ORDER — CHLORHEXIDINE GLUCONATE CLOTH 2 % EX PADS: 6.0000 | MEDICATED_PAD | Freq: Once | CUTANEOUS | Status: DC

## 2022-10-04 MED ORDER — PHENYLEPHRINE 80 MCG/ML (10ML) SYRINGE FOR IV PUSH (FOR BLOOD PRESSURE SUPPORT)
PREFILLED_SYRINGE | INTRAVENOUS | Status: AC
  Filled 2022-10-04: qty 10

## 2022-10-04 MED ORDER — ONDANSETRON HCL 4 MG/2ML IJ SOLN: 4.0000 mg | Freq: Four times a day (QID) | INTRAMUSCULAR | Status: DC | PRN

## 2022-10-04 MED ORDER — ACETAMINOPHEN 325 MG PO TABS
650.0000 mg | ORAL_TABLET | ORAL | Status: DC | PRN
  Administered 2022-10-05: 650 mg via ORAL
  Filled 2022-10-04: qty 2

## 2022-10-04 MED ORDER — MEPERIDINE HCL 25 MG/ML IJ SOLN: 6.2500 mg | INTRAMUSCULAR | Status: DC | PRN

## 2022-10-04 MED ORDER — BUPIVACAINE LIPOSOME 1.3 % IJ SUSP
INTRAMUSCULAR | Status: DC | PRN
  Administered 2022-10-04: 20 mL

## 2022-10-04 MED ORDER — CEFAZOLIN SODIUM-DEXTROSE 2-4 GM/100ML-% IV SOLN
2.0000 g | Freq: Three times a day (TID) | INTRAVENOUS | Status: AC
  Administered 2022-10-04 – 2022-10-05 (×2): 2 g via INTRAVENOUS
  Filled 2022-10-04 (×2): qty 100

## 2022-10-04 MED ORDER — BUPIVACAINE HCL (PF) 0.25 % IJ SOLN
INTRAMUSCULAR | Status: AC
  Filled 2022-10-04: qty 30

## 2022-10-04 MED ORDER — IRBESARTAN 75 MG PO TABS: 37.5000 mg | ORAL_TABLET | Freq: Every day | ORAL | Status: DC

## 2022-10-04 MED ORDER — ROCURONIUM BROMIDE 10 MG/ML (PF) SYRINGE
PREFILLED_SYRINGE | INTRAVENOUS | Status: DC | PRN
  Administered 2022-10-04: 10 mg via INTRAVENOUS
  Administered 2022-10-04: 20 mg via INTRAVENOUS
  Administered 2022-10-04: 70 mg via INTRAVENOUS
  Administered 2022-10-04: 30 mg via INTRAVENOUS
  Administered 2022-10-04: 20 mg via INTRAVENOUS

## 2022-10-04 MED ORDER — INSULIN ASPART 100 UNIT/ML IJ SOLN
0.0000 [IU] | Freq: Three times a day (TID) | INTRAMUSCULAR | Status: DC
  Administered 2022-10-04: 5 [IU] via SUBCUTANEOUS
  Administered 2022-10-05: 3 [IU] via SUBCUTANEOUS
  Administered 2022-10-05: 2 [IU] via SUBCUTANEOUS
  Administered 2022-10-05 – 2022-10-06 (×2): 3 [IU] via SUBCUTANEOUS

## 2022-10-04 MED ORDER — CEFAZOLIN SODIUM-DEXTROSE 2-4 GM/100ML-% IV SOLN
2.0000 g | INTRAVENOUS | Status: AC
  Administered 2022-10-04: 2 g via INTRAVENOUS
  Filled 2022-10-04: qty 100

## 2022-10-04 MED ORDER — LIDOCAINE 2% (20 MG/ML) 5 ML SYRINGE
INTRAMUSCULAR | Status: DC | PRN
  Administered 2022-10-04: 70 mg via INTRAVENOUS
  Administered 2022-10-04: 30 mg via INTRAVENOUS

## 2022-10-04 MED ORDER — DEXAMETHASONE SODIUM PHOSPHATE 10 MG/ML IJ SOLN
8.0000 mg | Freq: Four times a day (QID) | INTRAMUSCULAR | Status: AC
  Administered 2022-10-04 – 2022-10-05 (×2): 8 mg via INTRAVENOUS
  Filled 2022-10-04 (×2): qty 1

## 2022-10-04 MED ORDER — CYCLOBENZAPRINE HCL 10 MG PO TABS: 10.0000 mg | ORAL_TABLET | Freq: Three times a day (TID) | ORAL | Status: DC | PRN

## 2022-10-04 MED ORDER — SILDENAFIL CITRATE 20 MG PO TABS: 40.0000 mg | ORAL_TABLET | Freq: Every day | ORAL | Status: DC | PRN

## 2022-10-04 MED ORDER — METFORMIN HCL ER 500 MG PO TB24
1000.0000 mg | ORAL_TABLET | Freq: Every day | ORAL | Status: DC
  Administered 2022-10-05 – 2022-10-06 (×2): 1000 mg via ORAL
  Filled 2022-10-04 (×2): qty 2

## 2022-10-04 MED ORDER — SODIUM CHLORIDE 0.9% FLUSH: 3.0000 mL | Freq: Two times a day (BID) | INTRAVENOUS | Status: DC

## 2022-10-04 MED ORDER — LACTATED RINGERS IV SOLN: INTRAVENOUS | Status: DC

## 2022-10-04 MED ORDER — ONDANSETRON HCL 4 MG/2ML IJ SOLN
INTRAMUSCULAR | Status: AC
  Filled 2022-10-04: qty 2

## 2022-10-04 MED ORDER — EPHEDRINE SULFATE-NACL 50-0.9 MG/10ML-% IV SOSY
PREFILLED_SYRINGE | INTRAVENOUS | Status: DC | PRN
  Administered 2022-10-04: 5 mg via INTRAVENOUS

## 2022-10-04 MED ORDER — LACTATED RINGERS IV SOLN: INTRAVENOUS | Status: DC | PRN

## 2022-10-04 MED ORDER — PHENOL 1.4 % MT LIQD: 1.0000 | OROMUCOSAL | Status: DC | PRN

## 2022-10-04 MED ORDER — GLYCOPYRROLATE 0.2 MG/ML IJ SOLN
INTRAMUSCULAR | Status: DC | PRN
  Administered 2022-10-04 (×2): .1 mg via INTRAVENOUS

## 2022-10-04 MED ORDER — 0.9 % SODIUM CHLORIDE (POUR BTL) OPTIME
TOPICAL | Status: DC | PRN
  Administered 2022-10-04: 1000 mL

## 2022-10-04 MED ORDER — THROMBIN 20000 UNITS EX SOLR
CUTANEOUS | Status: DC | PRN
  Administered 2022-10-04: 20 mL via TOPICAL

## 2022-10-04 MED ORDER — ALPRAZOLAM 0.25 MG PO TABS
0.2500 mg | ORAL_TABLET | Freq: Two times a day (BID) | ORAL | Status: DC | PRN
  Administered 2022-10-05: 0.5 mg via ORAL
  Filled 2022-10-04: qty 2

## 2022-10-04 MED ORDER — ACETAMINOPHEN 650 MG RE SUPP: 650.0000 mg | RECTAL | Status: DC | PRN

## 2022-10-04 MED ORDER — THROMBIN 20000 UNITS EX SOLR
CUTANEOUS | Status: AC
  Filled 2022-10-04: qty 20000

## 2022-10-04 MED ORDER — THROMBIN 5000 UNITS EX SOLR
CUTANEOUS | Status: AC
  Filled 2022-10-04: qty 5000

## 2022-10-04 MED ORDER — IRBESARTAN 150 MG PO TABS
300.0000 mg | ORAL_TABLET | Freq: Every day | ORAL | Status: DC
  Administered 2022-10-04 – 2022-10-05 (×2): 300 mg via ORAL
  Filled 2022-10-04 (×2): qty 2

## 2022-10-04 MED ORDER — ALBUMIN HUMAN 5 % IV SOLN: INTRAVENOUS | Status: DC | PRN

## 2022-10-04 MED ORDER — OXYCODONE HCL 5 MG PO TABS: 5.0000 mg | ORAL_TABLET | Freq: Once | ORAL | Status: DC | PRN

## 2022-10-04 MED ORDER — ROCURONIUM BROMIDE 10 MG/ML (PF) SYRINGE
PREFILLED_SYRINGE | INTRAVENOUS | Status: AC
  Filled 2022-10-04: qty 10

## 2022-10-04 MED ORDER — SODIUM FLUORIDE 1.1 % DT PSTE: 1.0000 | PASTE | Freq: Every day | DENTAL | Status: DC

## 2022-10-04 MED ORDER — PROSIGHT PO TABS
1.0000 | ORAL_TABLET | Freq: Every day | ORAL | Status: DC
  Filled 2022-10-04: qty 1

## 2022-10-04 MED ORDER — DEXAMETHASONE SODIUM PHOSPHATE 10 MG/ML IJ SOLN
INTRAMUSCULAR | Status: DC | PRN
  Administered 2022-10-04: 5 mg via INTRAVENOUS

## 2022-10-04 MED ORDER — AMLODIPINE BESYLATE 5 MG PO TABS
5.0000 mg | ORAL_TABLET | Freq: Every day | ORAL | Status: DC
  Administered 2022-10-05: 5 mg via ORAL
  Filled 2022-10-04: qty 1

## 2022-10-04 MED ORDER — ONDANSETRON HCL 4 MG PO TABS: 4.0000 mg | ORAL_TABLET | Freq: Four times a day (QID) | ORAL | Status: DC | PRN

## 2022-10-04 MED ORDER — ALUM & MAG HYDROXIDE-SIMETH 200-200-20 MG/5ML PO SUSP: 30.0000 mL | Freq: Four times a day (QID) | ORAL | Status: DC | PRN

## 2022-10-04 MED ORDER — FENTANYL CITRATE (PF) 250 MCG/5ML IJ SOLN
INTRAMUSCULAR | Status: AC
  Filled 2022-10-04: qty 5

## 2022-10-04 MED ORDER — PHENYLEPHRINE 80 MCG/ML (10ML) SYRINGE FOR IV PUSH (FOR BLOOD PRESSURE SUPPORT)
PREFILLED_SYRINGE | INTRAVENOUS | Status: DC | PRN
  Administered 2022-10-04 (×2): 80 ug via INTRAVENOUS

## 2022-10-04 MED ORDER — ACETAMINOPHEN 500 MG PO TABS: 500.0000 mg | ORAL_TABLET | Freq: Four times a day (QID) | ORAL | Status: DC | PRN

## 2022-10-04 MED ORDER — OXYCODONE HCL 5 MG/5ML PO SOLN: 5.0000 mg | Freq: Once | ORAL | Status: DC | PRN

## 2022-10-04 MED ORDER — PHENYLEPHRINE HCL-NACL 20-0.9 MG/250ML-% IV SOLN
INTRAVENOUS | Status: DC | PRN
  Administered 2022-10-04: 25 ug/min via INTRAVENOUS

## 2022-10-04 MED ORDER — ONDANSETRON HCL 4 MG/2ML IJ SOLN
INTRAMUSCULAR | Status: DC | PRN
  Administered 2022-10-04: 4 mg via INTRAVENOUS

## 2022-10-04 MED ORDER — FENTANYL CITRATE (PF) 250 MCG/5ML IJ SOLN
INTRAMUSCULAR | Status: DC | PRN
  Administered 2022-10-04: 200 ug via INTRAVENOUS

## 2022-10-04 MED ORDER — AMISULPRIDE (ANTIEMETIC) 5 MG/2ML IV SOLN
10.0000 mg | Freq: Once | INTRAVENOUS | Status: AC
  Administered 2022-10-04: 10 mg via INTRAVENOUS

## 2022-10-04 MED ORDER — CHLORHEXIDINE GLUCONATE 0.12 % MT SOLN
OROMUCOSAL | Status: AC
  Administered 2022-10-04: 15 mL via OROMUCOSAL
  Filled 2022-10-04: qty 15

## 2022-10-04 MED ORDER — ACETAMINOPHEN 500 MG PO TABS
1000.0000 mg | ORAL_TABLET | Freq: Once | ORAL | Status: AC
  Administered 2022-10-04: 1000 mg via ORAL
  Filled 2022-10-04: qty 2

## 2022-10-04 MED ORDER — LIDOCAINE 2% (20 MG/ML) 5 ML SYRINGE
INTRAMUSCULAR | Status: AC
  Filled 2022-10-04: qty 5

## 2022-10-04 MED ORDER — PANTOPRAZOLE SODIUM 40 MG IV SOLR
40.0000 mg | Freq: Every day | INTRAVENOUS | Status: DC
  Administered 2022-10-04: 40 mg via INTRAVENOUS
  Filled 2022-10-04: qty 10

## 2022-10-04 MED ORDER — MIDAZOLAM HCL 2 MG/2ML IJ SOLN
INTRAMUSCULAR | Status: DC | PRN
  Administered 2022-10-04: 2 mg via INTRAVENOUS

## 2022-10-04 MED ORDER — BUPIVACAINE LIPOSOME 1.3 % IJ SUSP
INTRAMUSCULAR | Status: AC
  Filled 2022-10-04: qty 20

## 2022-10-04 MED ORDER — MIDAZOLAM HCL 2 MG/2ML IJ SOLN: 0.5000 mg | Freq: Once | INTRAMUSCULAR | Status: DC | PRN

## 2022-10-04 MED ORDER — THROMBIN 5000 UNITS EX SOLR
OROMUCOSAL | Status: DC | PRN
  Administered 2022-10-04: 5 mL via TOPICAL

## 2022-10-04 MED ORDER — DEXAMETHASONE SODIUM PHOSPHATE 10 MG/ML IJ SOLN
INTRAMUSCULAR | Status: AC
  Filled 2022-10-04: qty 1

## 2022-10-04 MED ORDER — MENTHOL 3 MG MT LOZG: 1.0000 | LOZENGE | OROMUCOSAL | Status: DC | PRN

## 2022-10-04 MED ORDER — SODIUM CHLORIDE 0.9% FLUSH: 3.0000 mL | INTRAVENOUS | Status: DC | PRN

## 2022-10-04 MED ORDER — POLYVINYL ALCOHOL 1.4 % OP SOLN: 1.0000 [drp] | Freq: Two times a day (BID) | OPHTHALMIC | Status: DC | PRN

## 2022-10-04 MED ORDER — HYDROMORPHONE HCL 1 MG/ML IJ SOLN: 0.5000 mg | INTRAMUSCULAR | Status: DC | PRN

## 2022-10-04 MED ORDER — LIDOCAINE-EPINEPHRINE 1 %-1:100000 IJ SOLN
INTRAMUSCULAR | Status: DC | PRN
  Administered 2022-10-04: 10 mL

## 2022-10-04 MED ORDER — CHLORHEXIDINE GLUCONATE 0.12 % MT SOLN: 15.0000 mL | Freq: Once | OROMUCOSAL | Status: AC

## 2022-10-04 MED ORDER — SODIUM CHLORIDE 0.9 % IV SOLN: 250.0000 mL | INTRAVENOUS | Status: DC

## 2022-10-04 MED ORDER — PROPOFOL 10 MG/ML IV BOLUS
INTRAVENOUS | Status: AC
  Filled 2022-10-04: qty 20

## 2022-10-04 MED ORDER — ORAL CARE MOUTH RINSE: 15.0000 mL | Freq: Once | OROMUCOSAL | Status: AC

## 2022-10-04 SURGICAL SUPPLY — 73 items
ADH SKN CLS APL DERMABOND .7 (GAUZE/BANDAGES/DRESSINGS) ×1
APL SKNCLS STERI-STRIP NONHPOA (GAUZE/BANDAGES/DRESSINGS) ×1
BAG COUNTER SPONGE SURGICOUNT (BAG) ×2 IMPLANT
BAG SPNG CNTER NS LX DISP (BAG) ×1
BASKET BONE COLLECTION (BASKET) ×2 IMPLANT
BENZOIN TINCTURE PRP APPL 2/3 (GAUZE/BANDAGES/DRESSINGS) ×2 IMPLANT
BLADE BONE MILL MEDIUM (MISCELLANEOUS) ×2 IMPLANT
BLADE CLIPPER SURG (BLADE) IMPLANT
BLADE SURG 11 STRL SS (BLADE) ×2 IMPLANT
BONE VIVIGEN FORMABLE 5.4CC (Bone Implant) ×1 IMPLANT
BUR CUTTER 7.0 ROUND (BURR) ×2 IMPLANT
BUR MATCHSTICK NEURO 3.0 LAGG (BURR) ×2 IMPLANT
CANISTER SUCT 3000ML PPV (MISCELLANEOUS) ×2 IMPLANT
CAP RELINE MOD TULIP RMM (Cap) IMPLANT
CNTNR URN SCR LID CUP LEK RST (MISCELLANEOUS) ×2 IMPLANT
CONT SPEC 4OZ STRL OR WHT (MISCELLANEOUS) ×1
COVER BACK TABLE 60X90IN (DRAPES) ×2 IMPLANT
DERMABOND ADVANCED .7 DNX12 (GAUZE/BANDAGES/DRESSINGS) ×2 IMPLANT
DRAPE C-ARM 42X72 X-RAY (DRAPES) ×4 IMPLANT
DRAPE C-ARMOR (DRAPES) IMPLANT
DRAPE HALF SHEET 40X57 (DRAPES) IMPLANT
DRAPE LAPAROTOMY 100X72X124 (DRAPES) ×2 IMPLANT
DRAPE SURG 17X23 STRL (DRAPES) ×2 IMPLANT
DRILL MAS PLIF 4.5MM DISPOSE IMPLANT
DRSG OPSITE 4X5.5 SM (GAUZE/BANDAGES/DRESSINGS) ×2 IMPLANT
DRSG OPSITE POSTOP 4X6 (GAUZE/BANDAGES/DRESSINGS) ×2 IMPLANT
DURAPREP 26ML APPLICATOR (WOUND CARE) ×2 IMPLANT
ELECT BLADE 4.0 EZ CLEAN MEGAD (MISCELLANEOUS) ×1
ELECT REM PT RETURN 9FT ADLT (ELECTROSURGICAL) ×1
ELECTRODE BLDE 4.0 EZ CLN MEGD (MISCELLANEOUS) IMPLANT
ELECTRODE REM PT RTRN 9FT ADLT (ELECTROSURGICAL) ×2 IMPLANT
EVACUATOR 1/8 PVC DRAIN (DRAIN) IMPLANT
EVACUATOR 3/16 PVC DRAIN (DRAIN) IMPLANT
GAUZE 4X4 16PLY ~~LOC~~+RFID DBL (SPONGE) IMPLANT
GAUZE SPONGE 4X4 12PLY STRL (GAUZE/BANDAGES/DRESSINGS) ×2 IMPLANT
GLOVE BIO SURGEON STRL SZ7 (GLOVE) IMPLANT
GLOVE BIO SURGEON STRL SZ8 (GLOVE) ×4 IMPLANT
GLOVE BIOGEL PI IND STRL 7.0 (GLOVE) IMPLANT
GLOVE EXAM NITRILE XL STR (GLOVE) IMPLANT
GLOVE INDICATOR 8.5 STRL (GLOVE) ×4 IMPLANT
GLOVE SS BIOGEL STRL SZ 7 (GLOVE) IMPLANT
GOWN STRL REUS W/ TWL LRG LVL3 (GOWN DISPOSABLE) IMPLANT
GOWN STRL REUS W/ TWL XL LVL3 (GOWN DISPOSABLE) ×4 IMPLANT
GOWN STRL REUS W/TWL 2XL LVL3 (GOWN DISPOSABLE) IMPLANT
GOWN STRL REUS W/TWL LRG LVL3 (GOWN DISPOSABLE)
GOWN STRL REUS W/TWL XL LVL3 (GOWN DISPOSABLE) ×4
GRAFT BNE MATRIX VG FRMBL MD 5 (Bone Implant) IMPLANT
GRAFT BONE PROTEIOS LRG 5CC (Orthopedic Implant) IMPLANT
KIT BASIN OR (CUSTOM PROCEDURE TRAY) ×2 IMPLANT
KIT GRAFTMAG DEL NEURO DISP (NEUROSURGERY SUPPLIES) IMPLANT
KIT TURNOVER KIT B (KITS) ×2 IMPLANT
MILL BONE PREP (MISCELLANEOUS) ×2 IMPLANT
NDL HYPO 25X1 1.5 SAFETY (NEEDLE) ×2 IMPLANT
NEEDLE HYPO 25X1 1.5 SAFETY (NEEDLE) ×1 IMPLANT
NS IRRIG 1000ML POUR BTL (IV SOLUTION) ×2 IMPLANT
PACK LAMINECTOMY NEURO (CUSTOM PROCEDURE TRAY) ×2 IMPLANT
PAD ARMBOARD 7.5X6 YLW CONV (MISCELLANEOUS) ×6 IMPLANT
ROD RELINE COCR LORD 5X40MM (Rod) IMPLANT
SCREW LOCK RSS 4.5/5.0MM (Screw) IMPLANT
SCREW SHANK RELINE MOD 5.5X30 (Screw) IMPLANT
SPACER TI SUSTAIN RT 10X26 8D (Spacer) IMPLANT
SPIKE FLUID TRANSFER (MISCELLANEOUS) ×2 IMPLANT
SPONGE SURGIFOAM ABS GEL 100 (HEMOSTASIS) ×2 IMPLANT
SPONGE T-LAP 4X18 ~~LOC~~+RFID (SPONGE) IMPLANT
STRIP CLOSURE SKIN 1/2X4 (GAUZE/BANDAGES/DRESSINGS) ×4 IMPLANT
SUT VIC AB 0 CT1 18XCR BRD8 (SUTURE) ×4 IMPLANT
SUT VIC AB 0 CT1 8-18 (SUTURE) ×2
SUT VIC AB 2-0 CT1 18 (SUTURE) ×2 IMPLANT
SUT VIC AB 4-0 PS2 27 (SUTURE) ×2 IMPLANT
TOWEL GREEN STERILE (TOWEL DISPOSABLE) ×2 IMPLANT
TOWEL GREEN STERILE FF (TOWEL DISPOSABLE) ×2 IMPLANT
TRAY FOLEY MTR SLVR 16FR STAT (SET/KITS/TRAYS/PACK) ×2 IMPLANT
WATER STERILE IRR 1000ML POUR (IV SOLUTION) ×2 IMPLANT

## 2022-10-04 NOTE — Anesthesia Postprocedure Evaluation (Signed)
Anesthesia Post Note  Patient: Dave Diaz  Procedure(s) Performed: Posterior Lumbar Interbody Fusion - Lum]bar four-Lumbar five - bilateral - Interbody Fusion (Back)     Patient location during evaluation: PACU Anesthesia Type: General Level of consciousness: awake and alert, patient cooperative and oriented Pain management: pain level controlled Vital Signs Assessment: post-procedure vital signs reviewed and stable Respiratory status: spontaneous breathing, nonlabored ventilation and respiratory function stable Cardiovascular status: blood pressure returned to baseline and stable Postop Assessment: no apparent nausea or vomiting Anesthetic complications: no   There were no known notable events for this encounter.  Last Vitals:  Vitals:   10/04/22 1500 10/04/22 1515  BP: (!) 143/90 (!) 141/83  Pulse: 96 72  Resp: 17 11  Temp:  36.7 C  SpO2: 95% 94%    Last Pain:  Vitals:   10/04/22 1445  TempSrc:   PainSc: 0-No pain                 Christena Sunderlin,E. Namira Rosekrans

## 2022-10-04 NOTE — H&P (Signed)
Dave Diaz is an 67 y.o. male.   Chief Complaint: Back bilateral hip and leg pain HPI: 67 year old gentleman longstanding back pain and leg pain severe spinal stenosis at L4-5 with a dynamic spondylolisthesis.  Due to his progression of clinical syndrome imaging findings of a conservative treatment I recommend decompressive laminectomy and interbody fusion at that level.  I extensively reviewed the risks and benefits of the operation with him as well as perioperative course expectations of outcome and alternatives of surgery and he understands and agrees to proceed forward.  Past Medical History:  Diagnosis Date   Cancer (HCC) 2014   basal cell   Diabetes mellitus without complication (HCC)    Dysphagia    Fatty liver    Hepatitis 1988   Hypertension    IBS (irritable bowel syndrome)    Macular degeneration     Past Surgical History:  Procedure Laterality Date   CATARACT EXTRACTION, BILATERAL  02/23/2004   KNEE ARTHROSCOPY Right 12/2021   KNEE SURGERY  1985   TONSILLECTOMY      Family History  Problem Relation Age of Onset   Stroke Mother    Diabetes Mother    Social History:  reports that he has never smoked. He has never used smokeless tobacco. He reports that he does not drink alcohol and does not use drugs.  Allergies:  Allergies  Allergen Reactions   Aspirin     BLEEDING   Symmetrel [Amantadine] Hives    HIVES    Medications Prior to Admission  Medication Sig Dispense Refill   acetaminophen (TYLENOL) 500 MG tablet Take 500-1,000 mg by mouth every 6 (six) hours as needed for moderate pain.     ALPRAZolam (XANAX) 0.5 MG tablet Take 0.25-0.5 mg by mouth 2 (two) times daily as needed for anxiety.     amLODipine (NORVASC) 5 MG tablet Take 5 mg by mouth daily.     carboxymethylcellulose (REFRESH PLUS) 0.5 % SOLN Place 1 drop into both eyes 2 (two) times daily as needed (dry eyes).     metFORMIN (GLUCOPHAGE-XR) 500 MG 24 hr tablet Take 1,000 mg by mouth in the  morning.     Multiple Vitamins-Minerals (PRESERVISION AREDS 2 PO) Take 2 tablets by mouth in the morning.     SODIUM FLUORIDE 5000 PPM 1.1 % PSTE Place 1 Application onto teeth at bedtime.     valsartan (DIOVAN) 320 MG tablet Take 320 mg by mouth daily.     sildenafil (REVATIO) 20 MG tablet Take 40 mg by mouth daily as needed (ED).      Results for orders placed or performed during the hospital encounter of 10/04/22 (from the past 48 hour(s))  Glucose, capillary     Status: Abnormal   Collection Time: 10/04/22 10:09 AM  Result Value Ref Range   Glucose-Capillary 131 (H) 70 - 99 mg/dL    Comment: Glucose reference range applies only to samples taken after fasting for at least 8 hours.   No results found.  Review of Systems  Musculoskeletal:  Positive for back pain.  Neurological:  Positive for numbness.    Blood pressure (!) 172/95, pulse 74, temperature 97.9 F (36.6 C), temperature source Oral, resp. rate 18, height 6\' 2"  (1.88 m), weight 97.5 kg, SpO2 99%. Physical Exam HENT:     Head: Normocephalic.     Right Ear: Tympanic membrane normal.     Mouth/Throat:     Mouth: Mucous membranes are moist.  Eyes:     Pupils:  Pupils are equal, round, and reactive to light.  Cardiovascular:     Rate and Rhythm: Normal rate.  Pulmonary:     Effort: Pulmonary effort is normal.  Abdominal:     General: Abdomen is flat.  Musculoskeletal:        General: Normal range of motion.  Neurological:     Mental Status: He is alert.     Comments: Strength is 5-5 iliopsoas, quads, hamstrings, gastroc, into tibialis, and EHL.      Assessment/Plan 67 year old presents for decompressive laminectomy interbody fusion L4-5  [name], MD 10/04/2022, 10:31 AM

## 2022-10-04 NOTE — Anesthesia Preprocedure Evaluation (Addendum)
Anesthesia Evaluation  Patient identified by MRN, date of birth, ID band Patient awake    Reviewed: Allergy & Precautions, NPO status , Patient's Chart, lab work & pertinent test results  History of Anesthesia Complications Negative for: history of anesthetic complications  Airway Mallampati: III  TM Distance: >3 FB Neck ROM: Full    Dental  (+) Dental Advisory Given   Pulmonary neg pulmonary ROS   breath sounds clear to auscultation       Cardiovascular hypertension, Pt. on medications (-) angina  Rhythm:Regular Rate:Normal     Neuro/Psych Back pain    GI/Hepatic negative GI ROS, Neg liver ROS,,,  Endo/Other  diabetes (glu 131), Oral Hypoglycemic Agents    Renal/GU negative Renal ROS     Musculoskeletal   Abdominal   Peds  Hematology   Anesthesia Other Findings   Reproductive/Obstetrics                             Anesthesia Physical Anesthesia Plan  ASA: 2  Anesthesia Plan: General   Post-op Pain Management: Tylenol PO (pre-op)*   Induction: Intravenous  PONV Risk Score and Plan: 2 and Ondansetron and Dexamethasone  Airway Management Planned: Oral ETT and Video Laryngoscope Planned  Additional Equipment: None  Intra-op Plan:   Post-operative Plan: Extubation in OR  Informed Consent: I have reviewed the patients History and Physical, chart, labs and discussed the procedure including the risks, benefits and alternatives for the proposed anesthesia with the patient or authorized representative who has indicated his/her understanding and acceptance.     Dental advisory given  Plan Discussed with: Surgeon and CRNA  Anesthesia Plan Comments:         Anesthesia Quick Evaluation

## 2022-10-04 NOTE — Op Note (Signed)
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  Postoperative diagnosis: Same.  Procedure: #1 decompressive lumbar laminectomy L4-5 with complete facetectomies and radical laminotomies the L4 and L5 nerve roots in excess of what would be needed with a standard interbody fusion.  2.  Posterior lumbar interbody fusion L4-5 utilizing the globus titanium insert and rotate cages packed with locally harvested autograft mixed with Vivigen and Proteus.  3.  Cortical screw fixation utilizing NuVasive cortical screw set L4-5.  Surgeon: Donalee Citrin.  Anesthesia: General.  EBL: 250.  HPI: 67 year old gentleman with progressive worsening back and bilateral right greater than left leg pain workup revealed severe spinal stenosis flexion-extension films showed dynamic spondylolisthesis at L4-5.  Into the patient's progression of clinical syndrome imaging findings of a conservative treatment I recommended decompressive laminectomy interbody fusion at that level.  I extensively went over the risks and benefits of the operation with him as well as perioperative course expectations of outcome and alternatives to surgery and he understood and agreed to proceed forward.  Operative procedure: Patient was brought into the OR was induced under general anesthesia and positioned prone the Wilson frame his back was prepped and draped in routine sterile fashion.  Preoperative x-ray localized the appropriate level so a midline incision was drawn out and after infiltration of 10 cc of light lidocaine with epi incision was made subperiosteal dissection was carried exposing the lamina of L3-L4-L5 bilaterally interoperative x-ray confirmed identification appropriate level.  So the spinous process at L4 was removed central decompression was begun there was marked hourglass compression of thecal sac and severe foraminal stenosis I  I subsequent performed complete medial facetectomies and marched out drilling off the inferomedial aspect of each L4 pedicle to decompress the L4 nerve root under biting the supra reticulating facet to gain access to the lateral margin of the space and decompress the lateral canal and foraminotomies were performed at the L5 nerve roots as well.  Then disc base was incised and under fluoroscopy the space was cleaned out endplates were prepared cages were inserted and extensive mount of autograft between the cages.  Then cortical screws were placed under fluoroscopy all screws excellent purchase rods were then fashioned I compressed L4 against L5 and then anchored everything in place.  All the foramina were then reinspected to confirm patency and no migration of graft material Gelfoam was ON top of the dura and the muscle and fascia approximate layers after placement of a medium Hemovac drain with interrupted Vicryl and Exparel was injected in the fascia and the subcutaneous tissue and skin was closed with Vicryl in the subcuticular.  Wound was dressed with Dermabond Steri-Strips benzoin and a sterile dressing patient recovery in stable condition.  At the end the case all needle counts and sponge counts were correct.

## 2022-10-04 NOTE — Anesthesia Procedure Notes (Addendum)
Procedure Name: Intubation Date/Time: 10/04/2022 11:31 AM  Performed by: Orlin Hilding, CRNAPre-anesthesia Checklist: Patient identified, Emergency Drugs available, Suction available, Patient being monitored and Timeout performed Patient Re-evaluated:Patient Re-evaluated prior to induction Oxygen Delivery Method: Circle system utilized Preoxygenation: Pre-oxygenation with 100% oxygen Induction Type: IV induction Ventilation: Mask ventilation without difficulty Laryngoscope Size: Mac and 4 Grade View: Grade III Tube type: Oral Tube size: 7.5 mm Number of attempts: 2 (First DL attempt by SRNA with G4 view; Second attempt by MD with G3 view) Airway Equipment and Method: Stylet and Bite block Placement Confirmation: ETT inserted through vocal cords under direct vision, positive ETCO2, CO2 detector and breath sounds checked- equal and bilateral Secured at: 23 cm Tube secured with: Tape Dental Injury: Teeth and Oropharynx as per pre-operative assessment  Comments: DL x2 attempts. First attempt by SRNA presented with G4 view. Second attempt by MD presented with G3 view with cricoid pressure. Patient was easy mask without oral airway. Would recommend using Glidescope for future.

## 2022-10-04 NOTE — Transfer of Care (Signed)
Immediate Anesthesia Transfer of Care Note  Patient: Dave Diaz  Procedure(s) Performed: Posterior Lumbar Interbody Fusion - Lum]bar four-Lumbar five - bilateral - Interbody Fusion (Back)  Patient Location: PACU  Anesthesia Type:General  Level of Consciousness: awake, drowsy, and patient cooperative  Airway & Oxygen Therapy: Patient Spontanous Breathing and Patient connected to face mask oxygen  Post-op Assessment: Report given to RN and Post -op Vital signs reviewed and stable  Post vital signs: Reviewed and stable  Last Vitals:  Vitals Value Taken Time  BP 157/99 10/04/22 1446  Temp    Pulse 99 10/04/22 1451  Resp 17 10/04/22 1451  SpO2 97 % 10/04/22 1451  Vitals shown include unfiled device data.  Last Pain:  Vitals:   10/04/22 1006  TempSrc: Oral  PainSc:          Complications: There were no known notable events for this encounter.

## 2022-10-05 DIAGNOSIS — Z833 Family history of diabetes mellitus: Secondary | ICD-10-CM | POA: Diagnosis not present

## 2022-10-05 DIAGNOSIS — Z79899 Other long term (current) drug therapy: Secondary | ICD-10-CM | POA: Diagnosis not present

## 2022-10-05 DIAGNOSIS — M48061 Spinal stenosis, lumbar region without neurogenic claudication: Secondary | ICD-10-CM | POA: Diagnosis present

## 2022-10-05 DIAGNOSIS — I10 Essential (primary) hypertension: Secondary | ICD-10-CM | POA: Diagnosis present

## 2022-10-05 DIAGNOSIS — Z823 Family history of stroke: Secondary | ICD-10-CM | POA: Diagnosis not present

## 2022-10-05 DIAGNOSIS — Z85828 Personal history of other malignant neoplasm of skin: Secondary | ICD-10-CM | POA: Diagnosis not present

## 2022-10-05 DIAGNOSIS — Z7984 Long term (current) use of oral hypoglycemic drugs: Secondary | ICD-10-CM | POA: Diagnosis not present

## 2022-10-05 DIAGNOSIS — Z886 Allergy status to analgesic agent status: Secondary | ICD-10-CM | POA: Diagnosis not present

## 2022-10-05 DIAGNOSIS — K760 Fatty (change of) liver, not elsewhere classified: Secondary | ICD-10-CM | POA: Diagnosis present

## 2022-10-05 DIAGNOSIS — E119 Type 2 diabetes mellitus without complications: Secondary | ICD-10-CM | POA: Diagnosis present

## 2022-10-05 DIAGNOSIS — Z888 Allergy status to other drugs, medicaments and biological substances status: Secondary | ICD-10-CM | POA: Diagnosis not present

## 2022-10-05 DIAGNOSIS — M4316 Spondylolisthesis, lumbar region: Secondary | ICD-10-CM | POA: Diagnosis present

## 2022-10-05 DIAGNOSIS — H3530 Unspecified macular degeneration: Secondary | ICD-10-CM | POA: Diagnosis present

## 2022-10-05 LAB — GLUCOSE, CAPILLARY
Glucose-Capillary: 166 mg/dL — ABNORMAL HIGH (ref 70–99)
Glucose-Capillary: 136 mg/dL — ABNORMAL HIGH (ref 70–99)
Glucose-Capillary: 157 mg/dL — ABNORMAL HIGH (ref 70–99)

## 2022-10-05 MED ORDER — PANTOPRAZOLE SODIUM 40 MG PO TBEC
40.0000 mg | DELAYED_RELEASE_TABLET | Freq: Every day | ORAL | Status: DC
  Administered 2022-10-05: 40 mg via ORAL
  Filled 2022-10-05: qty 1

## 2022-10-05 NOTE — Progress Notes (Signed)
Subjective: Patient reports doing well condition of back pain but much better than preop  Objective: Vital signs in last 24 hours: Temp:  [97.7 F (36.5 C)-98 F (36.7 C)] 97.7 F (36.5 C) (08/13 0714) Pulse Rate:  [63-96] 69 (08/13 0714) Resp:  [11-18] 16 (08/13 0714) BP: (122-186)/(73-99) 122/73 (08/13 0714) SpO2:  [94 %-100 %] 100 % (08/13 0714) Weight:  [97.5 kg] 97.5 kg (08/12 1006)  Intake/Output from previous day: 08/12 0701 - 08/13 0700 In: 2850 [P.O.:480; I.V.:1600; IV Piggyback:770] Out: 855 [Urine:275; Drains:430; Blood:150] Intake/Output this shift: No intake/output data recorded.  Strength 5 out of 5 and clean dry and intact  Lab Results: No results for input(s): "WBC", "HGB", "HCT", "PLT" in the last 72 hours. BMET No results for input(s): "NA", "K", "CL", "CO2", "GLUCOSE", "BUN", "CREATININE", "CALCIUM" in the last 72 hours.  Studies/Results: DG Lumbar Spine 2-3 Views  Result Date: 10/04/2022 CLINICAL DATA:  Fluoroscopic assistance for lumbar spinal fusion EXAM: LUMBAR SPINE - 2-3 VIEW COMPARISON:  02/09/2019 FINDINGS: Fluoroscopic images show posterior surgical fusion at the L4-L5 level. Intervertebral disc spacer is noted in place. Fluoroscopy time 45 seconds. Radiation dose 30.45 mGy. IMPRESSION: Fluoroscopic assistance was provided for lumbar fusion at L4-L5 level. Electronically Signed   By: Ernie Avena M.D.   On: 10/04/2022 17:59   DG C-Arm 1-60 Min-No Report  Result Date: 10/04/2022 Fluoroscopy was utilized by the requesting physician.  No radiographic interpretation.   DG C-Arm 1-60 Min-No Report  Result Date: 10/04/2022 Fluoroscopy was utilized by the requesting physician.  No radiographic interpretation.   DG C-Arm 1-60 Min-No Report  Result Date: 10/04/2022 Fluoroscopy was utilized by the requesting physician.  No radiographic interpretation.    Assessment/Plan: Continue observe drain output low but high for discharge mobilize with  physical therapy.  LOS: 0 days     Dave Diaz 10/05/2022, 8:12 AM

## 2022-10-05 NOTE — Evaluation (Signed)
Occupational Therapy Evaluation Patient Details Name: Dave Diaz MRN: [id number] DOB: 03-Jan-1956 Today's Date: 10/05/2022   History of Present Illness Pt is a 67 y/o male presenting on 8/12 for same day L4-5 PLIF. PMH includes: DM, basal cell CA, dysphagia, hepatitis, HTN, IBS, macular degeneration, R TKA.   Clinical Impression   Patient admitted for above and presents with problem list below.  PTA pt was independent and driving. Patient was educated on brace mgmt and wear schedule, back precautions, ADL compensatory techniques, AE/DME, mobility progression, safety and recommendations.  Today, pt demonstrated ability to complete bed mobility with supervision to min guard, transfers with modified independence, functional mobility with supervision to modified independence, and ADLs with modified independence after education of compensatory techniques.  At discharge, pt will have support from spouse as needed.  Based on performance today, no further OT needs identified.  OT will sign off.        If plan is discharge home, recommend the following: Direct supervision/assist for financial management;Direct supervision/assist for medications management;Help with stairs or ramp for entrance;Assistance with cooking/housework    Functional Status Assessment  Patient has had a recent decline in their functional status and demonstrates the ability to make significant improvements in function in a reasonable and predictable amount of time.  Equipment Recommendations  None recommended by OT    Recommendations for Other Services       Precautions / Restrictions Precautions Precautions: Back Precaution Booklet Issued: Yes (comment) Precaution Comments: reviewed with pt Required Braces or Orthoses: Spinal Brace Spinal Brace: Lumbar corset;Applied in sitting position Restrictions Weight Bearing Restrictions: No      Mobility Bed Mobility Overal bed mobility: Needs Assistance Bed Mobility:  Rolling, Sidelying to Sit, Sit to Sidelying Rolling: Supervision Sidelying to sit: Supervision     Sit to sidelying: Contact guard assist General bed mobility comments: supervision for safety, min guard to bring LEs back to supine but no physical assistance required. plans to sleep in recliner initally    Transfers Overall transfer level: Modified independent                 General transfer comment: cueing for upright posture but no physical assistance required      Balance Overall balance assessment: Needs assistance Sitting-balance support: No upper extremity supported, Feet supported Sitting balance-Leahy Scale: Good     Standing balance support: During functional activity, No upper extremity supported Standing balance-Leahy Scale: Fair                             ADL either performed or assessed with clinical judgement   ADL Overall ADL's : Modified independent                                     Functional mobility during ADLs: Modified independent General ADL Comments: pt demonstrating ability to complete ADLs and functional mobiltiy without assist after education of back precautions and compensatory techniques     Vision   Vision Assessment?: No apparent visual deficits     Perception         Praxis         Pertinent Vitals/Pain Pain Assessment Pain Assessment: Faces Faces Pain Scale: Hurts a little bit Pain Location: back Pain Descriptors / Indicators: Discomfort, Operative site guarding Pain Intervention(s): Limited activity within patient's tolerance, Monitored during session, Repositioned  Extremity/Trunk Assessment Upper Extremity Assessment Upper Extremity Assessment: Overall WFL for tasks assessed   Lower Extremity Assessment Lower Extremity Assessment: Defer to PT evaluation   Cervical / Trunk Assessment Cervical / Trunk Assessment: Back Surgery   Communication Communication Communication: No  apparent difficulties   Cognition Arousal: Alert Behavior During Therapy: WFL for tasks assessed/performed Overall Cognitive Status: Within Functional Limits for tasks assessed                                       General Comments  spouse at side and supportive    Exercises     Shoulder Instructions      Home Living Family/patient expects to be discharged to:: Private residence Living Arrangements: Spouse/significant other Available Help at Discharge: Family;Available 24 hours/day Type of Home: House Home Access: Stairs to enter [employer] of Steps: 2 Entrance Stairs-Rails: None Home Layout: One level     Bathroom Shower/Tub: Producer, television/film/video: Standard     Home Equipment: Agricultural consultant (2 wheels);Standard Walker;BSC/3in1          Prior Functioning/Environment Prior Level of Function : Independent/Modified Independent;Driving                        OT Problem List: Decreased knowledge of use of DME or AE;Decreased knowledge of precautions;Pain      OT Treatment/Interventions: Self-care/ADL training;DME and/or AE instruction;Therapeutic activities;Patient/family education;Balance training    OT Goals(Current goals can be found in the care plan section) Acute Rehab OT Goals Patient Stated Goal: home OT Goal Formulation: With patient  OT Frequency: Min 1X/week    Co-evaluation              AM-PAC OT "6 Clicks" Daily Activity     Outcome Measure Help from another person eating meals?: None Help from another person taking care of personal grooming?: None Help from another person toileting, which includes using toliet, bedpan, or urinal?: None Help from another person bathing (including washing, rinsing, drying)?: None Help from another person to put on and taking off regular upper body clothing?: None Help from another person to put on and taking off regular lower body clothing?: None 6 Click Score:  24   End of Session Equipment Utilized During Treatment: Back brace Nurse Communication: Mobility status  Activity Tolerance: Patient tolerated treatment well Patient left: in bed;with call bell/phone within reach;with family/visitor present  OT Visit Diagnosis: Unsteadiness on feet (R26.81);Pain Pain - part of body:  (back)                Time: 1610-9604 OT Time Calculation (min): 40 min Charges:  OT General Charges $OT Visit: 1 Visit OT Evaluation $OT Eval Low Complexity: 1 Low OT Treatments $Self Care/Home Management : 23-37 mins  Barry Brunner, OT Acute Rehabilitation Services Office [phone]   Chancy Milroy 10/05/2022, 10:37 AM

## 2022-10-06 LAB — GLUCOSE, CAPILLARY: Glucose-Capillary: 151 mg/dL — ABNORMAL HIGH (ref 70–99)

## 2022-10-06 MED ORDER — TIZANIDINE HCL 2 MG PO TABS: 4.0000 mg | ORAL_TABLET | Freq: Three times a day (TID) | ORAL | 0 refills | Status: AC | PRN

## 2022-10-06 MED ORDER — HYDROCODONE-ACETAMINOPHEN 5-325 MG PO TABS: 2.0000 | ORAL_TABLET | ORAL | 0 refills | Status: AC | PRN

## 2022-10-06 NOTE — Plan of Care (Signed)
Pt doing well. Pt and wife given D/C instructions with verbal understanding. Rx's were sent to the pharmacy by MD. Pt's incision is clean and dry with no sign of infection. Pt's IV and Hemovac were removed prior to D/C. Pt D/C'd home via wheelchair per MD order. Pt is stable @ D/C and has no other needs at this time. Rema Fendt, RN

## 2022-10-06 NOTE — Discharge Summary (Signed)
Physician Discharge Summary  Patient ID: Dave Diaz MRN: [id number] DOB/AGE: 07-04-1955 67 y.o. Estimated body mass index is 27.6 kg/m as calculated from the following:   Height as of this encounter: 6\' 2"  (1.88 m).   Weight as of this encounter: 97.5 kg.   Admit date: 10/04/2022 Discharge date: 10/06/2022  Admission Diagnoses: Lumbar spinal stenosis and degenerative spondylolisthesis L4-5  Discharge Diagnoses: Same Principal Problem:   Spondylolisthesis at L4-L5 level   Discharged Condition: good  Hospital Course: Patient was admitted to the hospital underwent decompressive laminectomy interbody fusion L4-5.  Postoperatively patient did very well recovered in the floor on the floor was ambulating and voiding spontaneously drain output was putting out too much on postop day 1 for discharge so he was kept an additional day but by postop day 2 drainage is slowed down enough for discharge.  Patient be discharged with scheduled follow-up in 2 weeks.  Consults: Significant Diagnostic Studies: Treatments: Decompressive laminectomy interbody fusion L4-5 Discharge Exam: Blood pressure (!) 152/77, pulse 65, temperature 98.4 F (36.9 C), temperature source Oral, resp. rate 20, height 6\' 2"  (1.88 m), weight 97.5 kg, SpO2 100%. Strength 5 out of 5 wound clean dry and intact  Disposition: Home   Allergies as of 10/06/2022       Reactions   Aspirin    BLEEDING   Symmetrel [amantadine] Hives   HIVES        Medication List     TAKE these medications    acetaminophen 500 MG tablet Commonly known as: TYLENOL Take 500-1,000 mg by mouth every 6 (six) hours as needed for moderate pain.   ALPRAZolam 0.5 MG tablet Commonly known as: XANAX Take 0.25-0.5 mg by mouth 2 (two) times daily as needed for anxiety.   amLODipine 5 MG tablet Commonly known as: NORVASC Take 5 mg by mouth daily.   carboxymethylcellulose 0.5 % Soln Commonly known as: REFRESH PLUS Place 1 drop into both  eyes 2 (two) times daily as needed (dry eyes).   HYDROcodone-acetaminophen 5-325 MG tablet Commonly known as: NORCO/VICODIN Take 2 tablets by mouth every 4 (four) hours as needed for severe pain ((score 7 to 10)).   metFORMIN 500 MG 24 hr tablet Commonly known as: GLUCOPHAGE-XR Take 1,000 mg by mouth in the morning.   PRESERVISION AREDS 2 PO Take 2 tablets by mouth in the morning.   sildenafil 20 MG tablet Commonly known as: REVATIO Take 40 mg by mouth daily as needed (ED).   Sodium Fluoride 5000 PPM 1.1 % Pste Generic drug: Sodium Fluoride Place 1 Application onto teeth at bedtime.   tiZANidine 2 MG tablet Commonly known as: ZANAFLEX Take 2 tablets (4 mg total) by mouth every 8 (eight) hours as needed for muscle spasms.   valsartan 320 MG tablet Commonly known as: DIOVAN Take 320 mg by mouth daily.         Signed: [name] 10/06/2022, 8:26 AM

## 2022-10-20 ENCOUNTER — Encounter (INDEPENDENT_AMBULATORY_CARE_PROVIDER_SITE_OTHER): Payer: Medicare Other | Admitting: Ophthalmology

## 2022-10-20 DIAGNOSIS — H34832 Tributary (branch) retinal vein occlusion, left eye, with macular edema: Secondary | ICD-10-CM

## 2022-10-20 DIAGNOSIS — H353132 Nonexudative age-related macular degeneration, bilateral, intermediate dry stage: Secondary | ICD-10-CM

## 2022-10-20 DIAGNOSIS — I10 Essential (primary) hypertension: Secondary | ICD-10-CM | POA: Diagnosis not present

## 2022-10-20 DIAGNOSIS — H35033 Hypertensive retinopathy, bilateral: Secondary | ICD-10-CM | POA: Diagnosis not present

## 2022-10-20 DIAGNOSIS — H43813 Vitreous degeneration, bilateral: Secondary | ICD-10-CM

## 2022-11-22 ENCOUNTER — Encounter (INDEPENDENT_AMBULATORY_CARE_PROVIDER_SITE_OTHER): Payer: Medicare Other | Admitting: Ophthalmology

## 2022-11-22 DIAGNOSIS — H35033 Hypertensive retinopathy, bilateral: Secondary | ICD-10-CM | POA: Diagnosis not present

## 2022-11-22 DIAGNOSIS — H34832 Tributary (branch) retinal vein occlusion, left eye, with macular edema: Secondary | ICD-10-CM

## 2022-11-22 DIAGNOSIS — H353231 Exudative age-related macular degeneration, bilateral, with active choroidal neovascularization: Secondary | ICD-10-CM | POA: Diagnosis not present

## 2022-11-22 DIAGNOSIS — H43813 Vitreous degeneration, bilateral: Secondary | ICD-10-CM

## 2022-11-22 DIAGNOSIS — I10 Essential (primary) hypertension: Secondary | ICD-10-CM | POA: Diagnosis not present

## 2022-12-27 ENCOUNTER — Encounter (INDEPENDENT_AMBULATORY_CARE_PROVIDER_SITE_OTHER): Payer: Medicare Other | Admitting: Ophthalmology

## 2022-12-27 DIAGNOSIS — H34832 Tributary (branch) retinal vein occlusion, left eye, with macular edema: Secondary | ICD-10-CM | POA: Diagnosis not present

## 2022-12-27 DIAGNOSIS — H353132 Nonexudative age-related macular degeneration, bilateral, intermediate dry stage: Secondary | ICD-10-CM

## 2022-12-27 DIAGNOSIS — H35033 Hypertensive retinopathy, bilateral: Secondary | ICD-10-CM | POA: Diagnosis not present

## 2022-12-27 DIAGNOSIS — I10 Essential (primary) hypertension: Secondary | ICD-10-CM

## 2022-12-27 DIAGNOSIS — H43813 Vitreous degeneration, bilateral: Secondary | ICD-10-CM

## 2023-01-12 ENCOUNTER — Encounter (INDEPENDENT_AMBULATORY_CARE_PROVIDER_SITE_OTHER): Payer: Medicare Other | Admitting: Ophthalmology

## 2023-01-12 DIAGNOSIS — D3132 Benign neoplasm of left choroid: Secondary | ICD-10-CM

## 2023-01-12 DIAGNOSIS — I10 Essential (primary) hypertension: Secondary | ICD-10-CM | POA: Diagnosis not present

## 2023-01-12 DIAGNOSIS — H34832 Tributary (branch) retinal vein occlusion, left eye, with macular edema: Secondary | ICD-10-CM | POA: Diagnosis not present

## 2023-01-12 DIAGNOSIS — H43813 Vitreous degeneration, bilateral: Secondary | ICD-10-CM

## 2023-01-12 DIAGNOSIS — H35372 Puckering of macula, left eye: Secondary | ICD-10-CM

## 2023-01-12 DIAGNOSIS — H35033 Hypertensive retinopathy, bilateral: Secondary | ICD-10-CM

## 2023-01-12 DIAGNOSIS — H353132 Nonexudative age-related macular degeneration, bilateral, intermediate dry stage: Secondary | ICD-10-CM

## 2023-01-31 ENCOUNTER — Encounter (INDEPENDENT_AMBULATORY_CARE_PROVIDER_SITE_OTHER): Payer: Medicare Other | Admitting: Ophthalmology

## 2023-01-31 DIAGNOSIS — H353132 Nonexudative age-related macular degeneration, bilateral, intermediate dry stage: Secondary | ICD-10-CM

## 2023-01-31 DIAGNOSIS — H34832 Tributary (branch) retinal vein occlusion, left eye, with macular edema: Secondary | ICD-10-CM | POA: Diagnosis not present

## 2023-01-31 DIAGNOSIS — H43813 Vitreous degeneration, bilateral: Secondary | ICD-10-CM

## 2023-01-31 DIAGNOSIS — H35033 Hypertensive retinopathy, bilateral: Secondary | ICD-10-CM

## 2023-01-31 DIAGNOSIS — H35372 Puckering of macula, left eye: Secondary | ICD-10-CM

## 2023-01-31 DIAGNOSIS — I10 Essential (primary) hypertension: Secondary | ICD-10-CM | POA: Diagnosis not present

## 2023-01-31 DIAGNOSIS — D3132 Benign neoplasm of left choroid: Secondary | ICD-10-CM

## 2023-02-10 ENCOUNTER — Other Ambulatory Visit: Payer: Self-pay | Admitting: Neurosurgery

## 2023-02-10 DIAGNOSIS — M4316 Spondylolisthesis, lumbar region: Secondary | ICD-10-CM

## 2023-02-21 ENCOUNTER — Ambulatory Visit
Admission: RE | Admit: 2023-02-21 | Discharge: 2023-02-21 | Disposition: A | Discharge status: Home / Self Care | Payer: Medicare Other | Source: Ambulatory Visit | Attending: Neurosurgery | Admitting: Neurosurgery

## 2023-02-21 DIAGNOSIS — M4316 Spondylolisthesis, lumbar region: Secondary | ICD-10-CM

## 2023-03-07 ENCOUNTER — Encounter (INDEPENDENT_AMBULATORY_CARE_PROVIDER_SITE_OTHER): Payer: Medicare Other | Admitting: Ophthalmology

## 2023-03-08 ENCOUNTER — Encounter (INDEPENDENT_AMBULATORY_CARE_PROVIDER_SITE_OTHER): Payer: Medicare Other | Admitting: Ophthalmology

## 2023-03-08 DIAGNOSIS — H34832 Tributary (branch) retinal vein occlusion, left eye, with macular edema: Secondary | ICD-10-CM

## 2023-03-08 DIAGNOSIS — H353132 Nonexudative age-related macular degeneration, bilateral, intermediate dry stage: Secondary | ICD-10-CM

## 2023-03-08 DIAGNOSIS — H35033 Hypertensive retinopathy, bilateral: Secondary | ICD-10-CM | POA: Diagnosis not present

## 2023-03-08 DIAGNOSIS — H35372 Puckering of macula, left eye: Secondary | ICD-10-CM | POA: Diagnosis not present

## 2023-03-08 DIAGNOSIS — H43813 Vitreous degeneration, bilateral: Secondary | ICD-10-CM

## 2023-03-08 DIAGNOSIS — D3132 Benign neoplasm of left choroid: Secondary | ICD-10-CM

## 2023-04-12 ENCOUNTER — Encounter (INDEPENDENT_AMBULATORY_CARE_PROVIDER_SITE_OTHER): Payer: Medicare Other | Admitting: Ophthalmology

## 2023-04-12 DIAGNOSIS — H35372 Puckering of macula, left eye: Secondary | ICD-10-CM

## 2023-04-12 DIAGNOSIS — H353132 Nonexudative age-related macular degeneration, bilateral, intermediate dry stage: Secondary | ICD-10-CM | POA: Diagnosis not present

## 2023-04-12 DIAGNOSIS — H34832 Tributary (branch) retinal vein occlusion, left eye, with macular edema: Secondary | ICD-10-CM

## 2023-04-12 DIAGNOSIS — I10 Essential (primary) hypertension: Secondary | ICD-10-CM | POA: Diagnosis not present

## 2023-04-12 DIAGNOSIS — H43813 Vitreous degeneration, bilateral: Secondary | ICD-10-CM

## 2023-04-12 DIAGNOSIS — D3132 Benign neoplasm of left choroid: Secondary | ICD-10-CM

## 2023-04-12 DIAGNOSIS — H35033 Hypertensive retinopathy, bilateral: Secondary | ICD-10-CM | POA: Diagnosis not present

## 2023-05-18 ENCOUNTER — Encounter (INDEPENDENT_AMBULATORY_CARE_PROVIDER_SITE_OTHER): Payer: Medicare Other | Admitting: Ophthalmology

## 2023-05-18 DIAGNOSIS — H34832 Tributary (branch) retinal vein occlusion, left eye, with macular edema: Secondary | ICD-10-CM

## 2023-05-18 DIAGNOSIS — D3132 Benign neoplasm of left choroid: Secondary | ICD-10-CM

## 2023-05-18 DIAGNOSIS — H353132 Nonexudative age-related macular degeneration, bilateral, intermediate dry stage: Secondary | ICD-10-CM

## 2023-05-18 DIAGNOSIS — H35033 Hypertensive retinopathy, bilateral: Secondary | ICD-10-CM | POA: Diagnosis not present

## 2023-05-18 DIAGNOSIS — I10 Essential (primary) hypertension: Secondary | ICD-10-CM | POA: Diagnosis not present

## 2023-05-18 DIAGNOSIS — H43813 Vitreous degeneration, bilateral: Secondary | ICD-10-CM

## 2023-06-22 ENCOUNTER — Encounter (INDEPENDENT_AMBULATORY_CARE_PROVIDER_SITE_OTHER): Admitting: Ophthalmology

## 2023-06-22 DIAGNOSIS — H35372 Puckering of macula, left eye: Secondary | ICD-10-CM

## 2023-06-22 DIAGNOSIS — I10 Essential (primary) hypertension: Secondary | ICD-10-CM

## 2023-06-22 DIAGNOSIS — H35032 Hypertensive retinopathy, left eye: Secondary | ICD-10-CM

## 2023-06-22 DIAGNOSIS — H43812 Vitreous degeneration, left eye: Secondary | ICD-10-CM

## 2023-06-22 DIAGNOSIS — H34832 Tributary (branch) retinal vein occlusion, left eye, with macular edema: Secondary | ICD-10-CM

## 2023-06-22 DIAGNOSIS — H353122 Nonexudative age-related macular degeneration, left eye, intermediate dry stage: Secondary | ICD-10-CM | POA: Diagnosis not present

## 2023-07-27 ENCOUNTER — Encounter (INDEPENDENT_AMBULATORY_CARE_PROVIDER_SITE_OTHER): Admitting: Ophthalmology

## 2023-07-27 DIAGNOSIS — H43813 Vitreous degeneration, bilateral: Secondary | ICD-10-CM

## 2023-07-27 DIAGNOSIS — H35033 Hypertensive retinopathy, bilateral: Secondary | ICD-10-CM | POA: Diagnosis not present

## 2023-07-27 DIAGNOSIS — I10 Essential (primary) hypertension: Secondary | ICD-10-CM | POA: Diagnosis not present

## 2023-07-27 DIAGNOSIS — H35372 Puckering of macula, left eye: Secondary | ICD-10-CM

## 2023-07-27 DIAGNOSIS — H34832 Tributary (branch) retinal vein occlusion, left eye, with macular edema: Secondary | ICD-10-CM

## 2023-07-27 DIAGNOSIS — D3132 Benign neoplasm of left choroid: Secondary | ICD-10-CM

## 2023-08-31 ENCOUNTER — Encounter (INDEPENDENT_AMBULATORY_CARE_PROVIDER_SITE_OTHER): Admitting: Ophthalmology

## 2023-08-31 DIAGNOSIS — H353132 Nonexudative age-related macular degeneration, bilateral, intermediate dry stage: Secondary | ICD-10-CM

## 2023-08-31 DIAGNOSIS — I10 Essential (primary) hypertension: Secondary | ICD-10-CM

## 2023-08-31 DIAGNOSIS — H34812 Central retinal vein occlusion, left eye, with macular edema: Secondary | ICD-10-CM | POA: Diagnosis not present

## 2023-08-31 DIAGNOSIS — H35033 Hypertensive retinopathy, bilateral: Secondary | ICD-10-CM

## 2023-08-31 DIAGNOSIS — H43813 Vitreous degeneration, bilateral: Secondary | ICD-10-CM

## 2023-08-31 DIAGNOSIS — D3132 Benign neoplasm of left choroid: Secondary | ICD-10-CM

## 2023-10-05 ENCOUNTER — Encounter (INDEPENDENT_AMBULATORY_CARE_PROVIDER_SITE_OTHER): Admitting: Ophthalmology

## 2023-10-05 DIAGNOSIS — H34832 Tributary (branch) retinal vein occlusion, left eye, with macular edema: Secondary | ICD-10-CM

## 2023-10-05 DIAGNOSIS — H35033 Hypertensive retinopathy, bilateral: Secondary | ICD-10-CM | POA: Diagnosis not present

## 2023-10-05 DIAGNOSIS — H353132 Nonexudative age-related macular degeneration, bilateral, intermediate dry stage: Secondary | ICD-10-CM | POA: Diagnosis not present

## 2023-10-05 DIAGNOSIS — H43813 Vitreous degeneration, bilateral: Secondary | ICD-10-CM

## 2023-10-05 DIAGNOSIS — I10 Essential (primary) hypertension: Secondary | ICD-10-CM

## 2023-11-09 ENCOUNTER — Encounter (INDEPENDENT_AMBULATORY_CARE_PROVIDER_SITE_OTHER): Admitting: Ophthalmology

## 2023-11-09 DIAGNOSIS — I10 Essential (primary) hypertension: Secondary | ICD-10-CM

## 2023-11-09 DIAGNOSIS — D3132 Benign neoplasm of left choroid: Secondary | ICD-10-CM

## 2023-11-09 DIAGNOSIS — H43813 Vitreous degeneration, bilateral: Secondary | ICD-10-CM

## 2023-11-09 DIAGNOSIS — H35372 Puckering of macula, left eye: Secondary | ICD-10-CM

## 2023-11-09 DIAGNOSIS — H35033 Hypertensive retinopathy, bilateral: Secondary | ICD-10-CM

## 2023-11-09 DIAGNOSIS — H353132 Nonexudative age-related macular degeneration, bilateral, intermediate dry stage: Secondary | ICD-10-CM | POA: Diagnosis not present

## 2023-11-09 DIAGNOSIS — H34832 Tributary (branch) retinal vein occlusion, left eye, with macular edema: Secondary | ICD-10-CM | POA: Diagnosis not present

## 2023-12-14 ENCOUNTER — Encounter (INDEPENDENT_AMBULATORY_CARE_PROVIDER_SITE_OTHER): Admitting: Ophthalmology

## 2023-12-14 DIAGNOSIS — D3132 Benign neoplasm of left choroid: Secondary | ICD-10-CM

## 2023-12-14 DIAGNOSIS — I10 Essential (primary) hypertension: Secondary | ICD-10-CM | POA: Diagnosis not present

## 2023-12-14 DIAGNOSIS — H35033 Hypertensive retinopathy, bilateral: Secondary | ICD-10-CM | POA: Diagnosis not present

## 2023-12-14 DIAGNOSIS — H43813 Vitreous degeneration, bilateral: Secondary | ICD-10-CM

## 2023-12-14 DIAGNOSIS — H353132 Nonexudative age-related macular degeneration, bilateral, intermediate dry stage: Secondary | ICD-10-CM

## 2023-12-14 DIAGNOSIS — H34832 Tributary (branch) retinal vein occlusion, left eye, with macular edema: Secondary | ICD-10-CM

## 2024-01-13 ENCOUNTER — Encounter (INDEPENDENT_AMBULATORY_CARE_PROVIDER_SITE_OTHER): Admitting: Ophthalmology

## 2024-01-13 DIAGNOSIS — H35033 Hypertensive retinopathy, bilateral: Secondary | ICD-10-CM | POA: Diagnosis not present

## 2024-01-13 DIAGNOSIS — H353132 Nonexudative age-related macular degeneration, bilateral, intermediate dry stage: Secondary | ICD-10-CM | POA: Diagnosis not present

## 2024-01-13 DIAGNOSIS — I10 Essential (primary) hypertension: Secondary | ICD-10-CM

## 2024-01-13 DIAGNOSIS — D3132 Benign neoplasm of left choroid: Secondary | ICD-10-CM

## 2024-01-13 DIAGNOSIS — H34832 Tributary (branch) retinal vein occlusion, left eye, with macular edema: Secondary | ICD-10-CM

## 2024-01-13 DIAGNOSIS — H35372 Puckering of macula, left eye: Secondary | ICD-10-CM

## 2024-01-13 DIAGNOSIS — H43813 Vitreous degeneration, bilateral: Secondary | ICD-10-CM

## 2024-02-10 ENCOUNTER — Encounter (INDEPENDENT_AMBULATORY_CARE_PROVIDER_SITE_OTHER): Admitting: Ophthalmology

## 2024-02-10 DIAGNOSIS — H34832 Tributary (branch) retinal vein occlusion, left eye, with macular edema: Secondary | ICD-10-CM | POA: Diagnosis not present

## 2024-02-10 DIAGNOSIS — H353132 Nonexudative age-related macular degeneration, bilateral, intermediate dry stage: Secondary | ICD-10-CM | POA: Diagnosis not present

## 2024-02-10 DIAGNOSIS — I10 Essential (primary) hypertension: Secondary | ICD-10-CM

## 2024-02-10 DIAGNOSIS — H35033 Hypertensive retinopathy, bilateral: Secondary | ICD-10-CM

## 2024-02-10 DIAGNOSIS — H43813 Vitreous degeneration, bilateral: Secondary | ICD-10-CM | POA: Diagnosis not present

## 2024-02-10 DIAGNOSIS — D3132 Benign neoplasm of left choroid: Secondary | ICD-10-CM

## 2024-03-09 ENCOUNTER — Encounter (INDEPENDENT_AMBULATORY_CARE_PROVIDER_SITE_OTHER): Admitting: Ophthalmology

## 2024-03-09 DIAGNOSIS — H35033 Hypertensive retinopathy, bilateral: Secondary | ICD-10-CM | POA: Diagnosis not present

## 2024-03-09 DIAGNOSIS — H34832 Tributary (branch) retinal vein occlusion, left eye, with macular edema: Secondary | ICD-10-CM | POA: Diagnosis not present

## 2024-03-09 DIAGNOSIS — H353132 Nonexudative age-related macular degeneration, bilateral, intermediate dry stage: Secondary | ICD-10-CM

## 2024-03-09 DIAGNOSIS — H43813 Vitreous degeneration, bilateral: Secondary | ICD-10-CM | POA: Diagnosis not present

## 2024-03-09 DIAGNOSIS — I10 Essential (primary) hypertension: Secondary | ICD-10-CM

## 2024-04-06 ENCOUNTER — Encounter (INDEPENDENT_AMBULATORY_CARE_PROVIDER_SITE_OTHER): Admitting: Ophthalmology
